# Patient Record
Sex: Female | Born: 1975
Health system: Southern US, Community
[De-identification: ages and names within clinical notes are randomized; demographics above are authoritative.]

## PROBLEM LIST (undated history)

## (undated) DIAGNOSIS — J45909 Unspecified asthma, uncomplicated: Secondary | ICD-10-CM

## (undated) DIAGNOSIS — F32A Depression, unspecified: Secondary | ICD-10-CM

## (undated) DIAGNOSIS — Z973 Presence of spectacles and contact lenses: Secondary | ICD-10-CM

## (undated) DIAGNOSIS — Q796 Ehlers-Danlos syndrome, unspecified: Secondary | ICD-10-CM

## (undated) DIAGNOSIS — U071 COVID-19: Secondary | ICD-10-CM

## (undated) DIAGNOSIS — F909 Attention-deficit hyperactivity disorder, unspecified type: Secondary | ICD-10-CM

## (undated) DIAGNOSIS — F419 Anxiety disorder, unspecified: Secondary | ICD-10-CM

## (undated) DIAGNOSIS — B009 Herpesviral infection, unspecified: Secondary | ICD-10-CM

## (undated) DIAGNOSIS — I10 Essential (primary) hypertension: Secondary | ICD-10-CM

## (undated) HISTORY — DX: Unspecified asthma, uncomplicated: J45.909

## (undated) HISTORY — DX: Herpesviral infection, unspecified: B00.9

## (undated) HISTORY — DX: Essential (primary) hypertension: I10

## (undated) HISTORY — PX: NASAL SEPTUM SURGERY: SHX37

---

## 1998-07-20 ENCOUNTER — Inpatient Hospital Stay (HOSPITAL_COMMUNITY): Admission: AD | Admit: 1998-07-20 | Discharge: 1998-07-21 | Payer: Self-pay | Admitting: Obstetrics and Gynecology

## 1999-02-16 ENCOUNTER — Other Ambulatory Visit: Admission: RE | Admit: 1999-02-16 | Discharge: 1999-02-16 | Payer: Self-pay | Admitting: Obstetrics and Gynecology

## 2000-03-23 ENCOUNTER — Other Ambulatory Visit: Admission: RE | Admit: 2000-03-23 | Discharge: 2000-03-23 | Payer: Self-pay | Admitting: Obstetrics and Gynecology

## 2003-02-24 ENCOUNTER — Other Ambulatory Visit: Admission: RE | Admit: 2003-02-24 | Discharge: 2003-02-24 | Payer: Self-pay | Admitting: Family Medicine

## 2004-03-09 ENCOUNTER — Other Ambulatory Visit: Admission: RE | Admit: 2004-03-09 | Discharge: 2004-03-09 | Payer: Self-pay | Admitting: Family Medicine

## 2005-03-31 ENCOUNTER — Other Ambulatory Visit: Admission: RE | Admit: 2005-03-31 | Discharge: 2005-03-31 | Payer: Self-pay | Admitting: Family Medicine

## 2006-11-20 ENCOUNTER — Inpatient Hospital Stay (HOSPITAL_COMMUNITY): Admission: AD | Admit: 2006-11-20 | Discharge: 2006-11-20 | Payer: Self-pay | Admitting: Obstetrics and Gynecology

## 2006-12-07 ENCOUNTER — Inpatient Hospital Stay (HOSPITAL_COMMUNITY): Admission: AD | Admit: 2006-12-07 | Discharge: 2006-12-07 | Payer: Self-pay | Admitting: Obstetrics and Gynecology

## 2006-12-30 ENCOUNTER — Inpatient Hospital Stay (HOSPITAL_COMMUNITY): Admission: AD | Admit: 2006-12-30 | Discharge: 2006-12-30 | Payer: Self-pay | Admitting: Obstetrics and Gynecology

## 2007-01-03 ENCOUNTER — Inpatient Hospital Stay (HOSPITAL_COMMUNITY): Admission: AD | Admit: 2007-01-03 | Discharge: 2007-01-05 | Payer: Self-pay | Admitting: Obstetrics and Gynecology

## 2007-01-07 ENCOUNTER — Encounter: Admission: RE | Admit: 2007-01-07 | Discharge: 2007-02-06 | Payer: Self-pay | Admitting: Obstetrics and Gynecology

## 2007-02-07 ENCOUNTER — Encounter: Admission: RE | Admit: 2007-02-07 | Discharge: 2007-03-06 | Payer: Self-pay | Admitting: Obstetrics and Gynecology

## 2008-02-11 ENCOUNTER — Encounter (INDEPENDENT_AMBULATORY_CARE_PROVIDER_SITE_OTHER): Payer: Self-pay | Admitting: *Deleted

## 2008-02-27 ENCOUNTER — Ambulatory Visit: Payer: Self-pay | Admitting: Family Medicine

## 2008-02-27 DIAGNOSIS — J45909 Unspecified asthma, uncomplicated: Secondary | ICD-10-CM | POA: Insufficient documentation

## 2008-07-20 ENCOUNTER — Ambulatory Visit: Payer: Self-pay | Admitting: Family Medicine

## 2008-07-20 ENCOUNTER — Other Ambulatory Visit: Admission: RE | Admit: 2008-07-20 | Discharge: 2008-07-20 | Payer: Self-pay | Admitting: Family Medicine

## 2008-07-20 ENCOUNTER — Encounter: Payer: Self-pay | Admitting: Family Medicine

## 2008-07-21 LAB — CONVERTED CEMR LAB
Albumin: 3.9 g/dL (ref 3.5–5.2)
Chloride: 109 meq/L (ref 96–112)
Cholesterol: 145 mg/dL (ref 0–200)
Creatinine, Ser: 0.9 mg/dL (ref 0.4–1.2)
Eosinophils Absolute: 0.6 10*3/uL (ref 0.0–0.7)
Glucose, Bld: 88 mg/dL (ref 70–99)
HCT: 40.9 % (ref 36.0–46.0)
Hemoglobin: 14.4 g/dL (ref 12.0–15.0)
LDL Cholesterol: 91 mg/dL (ref 0–99)
Lymphocytes Relative: 23.7 % (ref 12.0–46.0)
MCHC: 35.3 g/dL (ref 30.0–36.0)
Monocytes Absolute: 0.3 10*3/uL (ref 0.1–1.0)
Monocytes Relative: 5.4 % (ref 3.0–12.0)
Neutrophils Relative %: 61 % (ref 43.0–77.0)
Platelets: 266 10*3/uL (ref 150.0–400.0)
TSH: 2.78 microintl units/mL (ref 0.35–5.50)
VLDL: 19.8 mg/dL (ref 0.0–40.0)

## 2008-07-22 ENCOUNTER — Encounter (INDEPENDENT_AMBULATORY_CARE_PROVIDER_SITE_OTHER): Payer: Self-pay | Admitting: *Deleted

## 2009-10-18 ENCOUNTER — Telehealth: Payer: Self-pay | Admitting: Family Medicine

## 2009-12-01 ENCOUNTER — Other Ambulatory Visit: Admission: RE | Admit: 2009-12-01 | Discharge: 2009-12-01 | Payer: Self-pay | Admitting: Family Medicine

## 2009-12-01 ENCOUNTER — Ambulatory Visit: Payer: Self-pay | Admitting: Family Medicine

## 2009-12-01 DIAGNOSIS — F329 Major depressive disorder, single episode, unspecified: Secondary | ICD-10-CM

## 2009-12-01 DIAGNOSIS — F32A Depression, unspecified: Secondary | ICD-10-CM | POA: Insufficient documentation

## 2009-12-01 DIAGNOSIS — J069 Acute upper respiratory infection, unspecified: Secondary | ICD-10-CM | POA: Insufficient documentation

## 2009-12-02 ENCOUNTER — Ambulatory Visit: Payer: Self-pay | Admitting: Family Medicine

## 2009-12-02 LAB — CONVERTED CEMR LAB
AST: 15 units/L (ref 0–37)
Alkaline Phosphatase: 75 units/L (ref 39–117)
BUN: 10 mg/dL (ref 6–23)
Basophils Relative: 0.7 % (ref 0.0–3.0)
Calcium: 9.2 mg/dL (ref 8.4–10.5)
Cholesterol: 171 mg/dL (ref 0–200)
Creatinine, Ser: 0.8 mg/dL (ref 0.4–1.2)
GFR calc non Af Amer: 82.14 mL/min (ref >60)
HCT: 40.4 % (ref 36.0–46.0)
Hemoglobin: 14 g/dL (ref 12.0–15.0)
MCHC: 34.7 g/dL (ref 30.0–36.0)
MCV: 95.7 fL (ref 78.0–100.0)
Monocytes Absolute: 0.5 10*3/uL (ref 0.1–1.0)
Neutrophils Relative %: 71.8 % (ref 43.0–77.0)
Potassium: 4.3 meq/L (ref 3.5–5.1)
RBC: 4.22 M/uL (ref 3.87–5.11)
RDW: 13.1 % (ref 11.5–14.6)
Sodium: 136 meq/L (ref 135–145)
Total Bilirubin: 1 mg/dL (ref 0.3–1.2)

## 2009-12-03 ENCOUNTER — Encounter: Payer: Self-pay | Admitting: Family Medicine

## 2009-12-03 LAB — CONVERTED CEMR LAB: Pap Smear: NEGATIVE

## 2010-04-12 NOTE — Letter (Signed)
Summary: Results Follow up Letter  [facility]  [address] [place], [place] [address]   Phone: [phone]  Fax: [phone]    12/03/2009 MRN: [id number]  [facility] [address] [place], [place]  [address]  Dear Ms. Gintz,  The following are the results of your recent test(s):  Test         Result    Pap Smear:        Normal            Sincerely,

## 2010-04-12 NOTE — Progress Notes (Signed)
Summary: Refill--Symbicort  Phone Note Refill Request Message from:  Patient on October 18, 2009 11:06 AM  Refills Requested: Medication #1:  SYMBICORT 160-4.5 MCG/ACT AERO 2 puffs two times a day.   Dosage confirmed as above?Dosage Confirmed   Supply Requested: 1 month [facility] ON [address] PT CAN BE CONTACTED AT [phone]  Next Appointment Scheduled: SEPTEMBER 21ST 2011  Follow-up for Phone Call        Patient home number is disconnected. Called patient and notified. Her new home number is [phone]. Lucious Groves CMA  October 18, 2009 3:58 PM     Prescriptions: SYMBICORT 160-4.5 MCG/ACT AERO (BUDESONIDE-FORMOTEROL FUMARATE) 2 puffs two times a day  #1 x 1   Entered by:   Lucious Groves CMA   Authorized by:   Loreen Freud DO   Signed by:   Lucious Groves CMA on 10/18/2009   Method used:   Electronically to        [facility]  [address] # [id number]* (retail)       [address]       [place], [place]  [address]       Ph: [phone] or [phone]       Fax: [phone]   RxID:   [id number]

## 2010-04-12 NOTE — Assessment & Plan Note (Signed)
Summary: CPX/FASTING/KN   Vital Signs:  Patient profile:   35 year old female Menstrual status:  regular LMP:     11/14/2009 Height:      64 inches Weight:      209.0 pounds BMI:     36.00 Temp:     98.4 degrees F oral Pulse rate:   72 / minute Pulse rhythm:   regular BP sitting:   120 / 90  (right arm)  Vitals Entered By: Almeta Monas CMA Duncan Dull) (December 01, 2009 9:03 AM) CC: CPX/Not fasting, Pap needed, URI symptoms LMP (date): 11/14/2009     Menstrual Status regular Enter LMP: 11/14/2009 Last PAP Result NEGATIVE FOR INTRAEPITHELIAL LESIONS OR MALIGNANCY.   History of Present Illness: Pt here for cpe and pap. Pt is complaining about anxiety and depression and ocd.  Pt is rehashing a lot in her head and she has trouble sleeping .  Pt also c/o gagging more even with brushing teeth.            This is a 35 year old woman who presents with URI symptoms.  The symptoms began 2 days ago.  The patient complains of nasal congestion, clear nasal discharge, and dry cough, but denies purulent nasal discharge, sore throat, productive cough, earache, and sick contacts.  The patient denies fever, low-grade fever (<100.5 degrees), fever of 100.5-103 degrees, fever of 103.1-104 degrees, fever to >104 degrees, stiff neck, dyspnea, wheezing, rash, vomiting, diarrhea, use of an antipyretic, and response to antipyretic.  The patient also reports itchy throat and sneezing.  The patient denies itchy watery eyes, seasonal symptoms, response to antihistamine, headache, muscle aches, and severe fatigue.  The patient denies the following risk factors for Strep sinusitis: unilateral facial pain, unilateral nasal discharge, poor response to decongestant, double sickening, tooth pain, Strep exposure, tender adenopathy, and absence of cough.    Asthma History    Initial Asthma Severity Rating:    Age range: 12+ years    Symptoms: 0-2 days/week    Nighttime Awakenings: 0-2/month    Interferes w/ normal  activity: no limitations    SABA use (not for EIB): 0-2 days/week    Exacerbations requiring oral systemic steroids: 0-1/year    Asthma Severity Assessment: Intermittent   Preventive Screening-Counseling & Management  Alcohol-Tobacco     Alcohol drinks/day: <1     Alcohol type: rare--wine or mixed drink     Smoking Status: never     Passive Smoke Exposure: no  Caffeine-Diet-Exercise     Caffeine use/day: 2     Does Patient Exercise: no     Exercise Counseling: to improve exercise regimen  Hep-HIV-STD-Contraception     HIV Risk: no     Sun Exposure-Excessive: remote  Safety-Violence-Falls     Seat Belt Use: 100  Current Medications (verified): 1)  Ortho-Cyclen (28) 0.25-35 Mg-Mcg Tabs (Norgestimate-Eth Estradiol) .... As Directed 2)  Singulair 10 Mg Tabs (Montelukast Sodium) .Marland Kitchen.. 1 By Mouth Once Daily 3)  Proventil Hfa 108 (90 Base) Mcg/act Aers (Albuterol Sulfate) .... 2 Puffs Qid As Needed 4)  Symbicort 160-4.5 Mcg/act Aero (Budesonide-Formoterol Fumarate) .... 2 Puffs Two Times A Day 5)  Flonase 50 Mcg/act Susp (Fluticasone Propionate) .... 2 Sprays Each Nostril Once Daily 6)  Claritin 10 Mg Tabs (Loratadine) .Marland Kitchen.. 1 By Mouth Once Daily As Needed 7)  Pristiq 50 Mg Xr24h-Tab (Desvenlafaxine Succinate) .Marland Kitchen.. 1 By Mouth Once Daily  Allergies (verified): 1)  ! Pcn  Past History:  Past Medical History: Last updated:  02/27/2008 Asthma  Past Surgical History: Last updated: 02/27/2008 none  Family History: Last updated: 12/01/2009 dm-father,paternal aunt htn-mother,father stroke-mother,maternal uncle,grandparent Family History of Aneurysm Aortic  Social History: Last updated: 02/27/2008 Occupation:Works with women going through chem--prothetics , wigs Married, 3 children Never Smoked Alcohol use-yes Drug use-no Regular exercise-yes  Risk Factors: Alcohol Use: <1 (12/01/2009) Caffeine Use: 2 (12/01/2009) Exercise: no (12/01/2009)  Risk Factors: Smoking  Status: never (12/01/2009) Passive Smoke Exposure: no (12/01/2009)  Family History: Reviewed history from 02/27/2008 and no changes required. dm-father,paternal aunt htn-mother,father stroke-mother,maternal uncle,grandparent Family History of Aneurysm Aortic  Social History: Reviewed history from 02/27/2008 and no changes required. Occupation:Works with women going through chem--prothetics , wigs Married, 3 children Never Smoked Alcohol use-yes Drug use-no Regular exercise-yes Does Patient Exercise:  no  Review of Systems      See HPI General:  Denies chills, fatigue, fever, loss of appetite, malaise, sleep disorder, sweats, weakness, and weight loss. Eyes:  Denies blurring, discharge, double vision, eye irritation, eye pain, halos, itching, light sensitivity, red eye, vision loss-1 eye, and vision loss-both eyes. ENT:  Complains of nasal congestion; denies decreased hearing, difficulty swallowing, ear discharge, earache, hoarseness, nosebleeds, postnasal drainage, ringing in ears, sinus pressure, and sore throat. CV:  Denies bluish discoloration of lips or nails, chest pain or discomfort, difficulty breathing at night, difficulty breathing while lying down, fainting, fatigue, leg cramps with exertion, lightheadness, near fainting, palpitations, shortness of breath with exertion, swelling of feet, swelling of hands, and weight gain. Resp:  Denies chest discomfort, chest pain with inspiration, cough, coughing up blood, excessive snoring, hypersomnolence, morning headaches, pleuritic, shortness of breath, sputum productive, and wheezing. GI:  Denies abdominal pain, bloody stools, change in bowel habits, constipation, dark tarry stools, diarrhea, excessive appetite, gas, hemorrhoids, indigestion, loss of appetite, nausea, vomiting, vomiting blood, and yellowish skin color. GU:  Denies abnormal vaginal bleeding, decreased libido, discharge, dysuria, genital sores, hematuria, incontinence,  nocturia, urinary frequency, and urinary hesitancy. MS:  Denies joint pain, joint redness, joint swelling, loss of strength, low back pain, mid back pain, muscle aches, muscle , cramps, muscle weakness, stiffness, and thoracic pain. Derm:  Denies changes in color of skin, changes in nail beds, dryness, excessive perspiration, flushing, hair loss, insect bite(s), itching, lesion(s), poor wound healing, and rash. Neuro:  Denies brief paralysis, difficulty with concentration, disturbances in coordination, falling down, headaches, inability to speak, memory loss, numbness, poor balance, seizures, sensation of room spinning, tingling, tremors, visual disturbances, and weakness. Psych:  Denies alternate hallucination ( auditory/visual), anxiety, depression, easily angered, easily tearful, irritability, mental problems, panic attacks, sense of great danger, suicidal thoughts/plans, thoughts of violence, unusual visions or sounds, and thoughts /plans of harming others. Endo:  Denies cold intolerance, excessive hunger, excessive thirst, excessive urination, heat intolerance, polyuria, and weight change. Heme:  Denies abnormal bruising, bleeding, enlarge lymph nodes, fevers, pallor, and skin discoloration. Allergy:  Complains of itching eyes, seasonal allergies, and sneezing; denies hives or rash and persistent infections.  Physical Exam  General:  Well-developed,well-nourished,in no acute distress; alert,appropriate and cooperative throughout examination Head:  Normocephalic and atraumatic without obvious abnormalities. No apparent alopecia or balding. Eyes:  vision grossly intact, pupils equal, pupils round, pupils reactive to light, and no injection.   Ears:  External ear exam shows no significant lesions or deformities.  Otoscopic examination reveals clear canals, tympanic membranes are intact bilaterally without bulging, retraction, inflammation or discharge. Hearing is grossly normal bilaterally. Nose:   no external deformity, mucosal erythema, and mucosal  edema.   Mouth:  Oral mucosa and oropharynx without lesions or exudates.  Teeth in good repair. Neck:  No deformities, masses, or tenderness noted. Chest Wall:  No deformities, masses, or tenderness noted. Breasts:  No mass, nodules, thickening, tenderness, bulging, retraction, inflamation, nipple discharge or skin changes noted.   Lungs:  Normal respiratory effort, chest expands symmetrically. Lungs are clear to auscultation, no crackles or wheezes. Heart:  normal rate and no murmur.   Abdomen:  Bowel sounds positive,abdomen soft and non-tender without masses, organomegaly or hernias noted. Genitalia:  Pelvic Exam:        External: normal female genitalia without lesions or masses        Vagina: normal without lesions or masses        Cervix: normal without lesions or masses        Adnexa: normal bimanual exam without masses or fullness        Uterus: normal by palpation        Pap smear: performed Msk:  normal ROM, no joint tenderness, no joint swelling, no joint warmth, no redness over joints, no joint deformities, no joint instability, and no crepitation.   Pulses:  R posterior tibial normal, R dorsalis pedis normal, R carotid normal, L posterior tibial normal, L dorsalis pedis normal, and L carotid normal.   Extremities:  No clubbing, cyanosis, edema, or deformity noted with normal full range of motion of all joints.   Neurologic:  No cranial nerve deficits noted. Station and gait are normal. Plantar reflexes are down-going bilaterally. DTRs are symmetrical throughout. Sensory, motor and coordinative functions appear intact. Skin:  Intact without suspicious lesions or rashes Cervical Nodes:  No lymphadenopathy noted Axillary Nodes:  No palpable lymphadenopathy Psych:  Cognition and judgment appear intact. Alert and cooperative with normal attention span and concentration. No apparent delusions, illusions, hallucinations   Impression &  Recommendations:  Problem # 1:  PREVENTIVE HEALTH CARE (ICD-V70.0) check fasting labs ghm utd  Problem # 2:  ASTHMA (ICD-493.90)  Her updated medication list for this problem includes:    Singulair 10 Mg Tabs (Montelukast sodium) .Marland Kitchen... 1 by mouth once daily    Proventil Hfa 108 (90 Base) Mcg/act Aers (Albuterol sulfate) .Marland Kitchen... 2 puffs qid as needed    Symbicort 160-4.5 Mcg/act Aero (Budesonide-formoterol fumarate) .Marland Kitchen... 2 puffs two times a day  Problem # 3:  URI (ICD-465.9)  Her updated medication list for this problem includes:    Claritin 10 Mg Tabs (Loratadine) .Marland Kitchen... 1 by mouth once daily as needed     flonase   Instructed on symptomatic treatment. Call if symptoms persist or worsen.   Problem # 4:  DEPRESSION, MILD (ICD-311)  Her updated medication list for this problem includes:    Pristiq 50 Mg Xr24h-tab (Desvenlafaxine succinate) .Marland Kitchen... 1 by mouth once daily  Complete Medication List: 1)  Ortho-cyclen (28) 0.25-35 Mg-mcg Tabs (Norgestimate-eth estradiol) .... As directed 2)  Singulair 10 Mg Tabs (Montelukast sodium) .Marland Kitchen.. 1 by mouth once daily 3)  Proventil Hfa 108 (90 Base) Mcg/act Aers (Albuterol sulfate) .... 2 puffs qid as needed 4)  Symbicort 160-4.5 Mcg/act Aero (Budesonide-formoterol fumarate) .... 2 puffs two times a day 5)  Flonase 50 Mcg/act Susp (Fluticasone propionate) .... 2 sprays each nostril once daily 6)  Claritin 10 Mg Tabs (Loratadine) .Marland Kitchen.. 1 by mouth once daily as needed 7)  Pristiq 50 Mg Xr24h-tab (Desvenlafaxine succinate) .Marland Kitchen.. 1 by mouth once daily  Other Orders: Admin 1st Vaccine (16109) Flu Vaccine  3yrs + (16109) Flu Vaccine Consent Questions     Do you have a history of severe allergic reactions to this vaccine? no    Any prior history of allergic reactions to egg and/or gelatin? no    Do you have a sensitivity to the preservative Thimersol? no    Do you have a past history of Guillan-Barre Syndrome? no    Do you currently have an acute febrile  illness? no    Have you ever had a severe reaction to latex? no    Vaccine information given and explained to patient? yes    Are you currently pregnant? no    Lot Number:[id number]   Exp Date:09/10/2010   Site Given  Left Deltoid IM 6)  Claritin 10 Mg Tabs (Loratadine) .Marland Kitchen.. 1 by mouth once daily as needed 7)  Pristiq 50 Mg Xr24h-tab (Desvenlafaxine succinate) .Marland Kitchen.. 1 by mouth once daily  Other Orders: Admin 1st Vaccine (60454) Flu Vaccine 53yrs + (09811) .lbflu  Patient Instructions: 1)  V70.0  cbcd, hep, bmp, lipid, tsh, ua--fasting labs 2)  f/u 4-6 weeks Prescriptions: CLARITIN 10 MG TABS (LORATADINE) 1 by mouth once daily as needed  #30 x 5   Entered and Authorized by:   Loreen Freud DO   Signed by:   Loreen Freud DO on 12/01/2009   Method used:   Electronically to        [facility] # [id number]* (retail)       [address]       [place], [place]  [address]       Ph: [phone] or [phone]       Fax: [phone]   RxID:   [id number] FLONASE 50 MCG/ACT SUSP (FLUTICASONE PROPIONATE) 2 sprays each nostril once daily  #1 x 5   Entered and Authorized by:   Loreen Freud DO   Signed by:   Loreen Freud DO on 12/01/2009   Method used:   Electronically to        [facility] # [id number]* (retail)       [address]       [place], [place]  [address]       Ph: [phone] or [phone]       Fax: [phone]   RxID:   [id number]   TD Result Date:  11/25/2003 TD Result:  given TD Next Due:  10 yr

## 2010-07-26 NOTE — H&P (Signed)
Katelyn Wilcox, Katelyn Wilcox              ACCOUNT NO.:  [id number]   MEDICAL RECORD NO.:  [id number]          PATIENT TYPE:  INP   LOCATION:  9140                          FACILITY:  [facility]   PHYSICIAN:  Hal Morales, M.D.DATE OF BIRTH:  [birth year]   DATE OF ADMISSION:  01/03/2007  DATE OF DISCHARGE:                              HISTORY & PHYSICAL   This is a 34 year old, gravida 3, para 2-0-0-2 at 39-6/7 weeks who  presented to [facility] with urge to push. She had called me about an hour  before with contractions that were every 3-12 minutes. History is  remarkable for:  1. Asthma.  2. PENICILLIN allergy.  3. Jehovah's Witness.  4. PIH starting at 32 weeks with a negative labs recently.  5. Group B strep positive.   ALLERGIES:  PENICILLIN.   OB HISTORY:  Remarkable for vaginal delivery in 1998 of a female infant at  [redacted] weeks gestation weighing 7 pounds 11 ounces, remarkable for a nuchal  cord. She had a vaginal delivery in 2000 of a female infant at [redacted] weeks  gestation weighing 7 pounds 7 ounces remarkable for a knot in the cord.   MEDICAL HISTORY:  Remarkable for hyperemesis with her first pregnancy,  childhood varicella, history of asthma.   SURGICAL HISTORY:  Remarkable for repair of deviated septum and removal  of wisdom teeth.   FAMILY HISTORY:  Remarkable for grandfather with heart disease both  parents with hypertension, mother with varicosities, father with  diabetes, sister with thyroid problems. Mother with migraines and  stroke. Grandparents with strokes.   GENETIC HISTORY:  Negative.   SOCIAL HISTORY:  The patient is married to Katelyn Wilcox who is involved  and supportive.  She is of the Jehovah's Witness faith. She denies any  alcohol, tobacco or drug use.   PRENATAL LABS:  Hemoglobin 13.8, platelets 293.  Blood type O+, antibody  screen negative, RPR nonreactive, rubella immune.  Hepatitis negative.  Cystic fibrosis declined.   HISTORY OF CURRENT PREGNANCY:  The  patient entered care at [redacted] weeks  gestation. She had a normal Pap and negative cultures at that time.  Ultrasound at 19 weeks was normal, Glucola at 26 weeks was normal. Blood  pressure was elevated 32 weeks. PIH labs are normal.  Group B strep was  positive at term. She had a 24-hour urine done at 36-1/2 weeks which was  75 and other labs were normal. Blood pressures ranged 120-130 over 72-90  in the late term and she had a BPP December 30, 2006 that was 6/8.   PHYSICAL EXAMINATION:  VITAL SIGNS:  Stable except for blood pressures  140-150s/90s.  HEENT:  Within normal limits.  Thyroid normal not enlarged.  CHEST:  Clear to auscultation.  HEART:  Regular rate and rhythm.  ABDOMEN:  Gravid, vertex to Leopold's.  EXTREMITIES:  Show trace edema and 2+ DTRs. Fetal heart rate was 150s  prior to delivery. Cervix was 10 cm and +1 station on admission.  Spontaneous vaginal delivery occurred at 2226 OA  over intact perineum  of a viable female infant weighing 6 pounds  12 ounces, Apgars 9&9,  placenta delivered spontaneously and intact with three-vessel cord.  There was a small first-degree perineal laceration which was repaired  with 3-0 Monocryl under a local anesthetic. EBL was less than 100 mL.   ASSESSMENT:  1. Intrauterine pregnancy at 39-6/7 weeks, now delivered.  2. Pregnancy induced hypertension.   PLAN:  1. Routine postpartum orders.  2. PIH labs.  3. Will check BPs and notify physician if elevated.      Katelyn Wilcox, C.N.M.      Hal Morales, M.D.  Electronically Signed    MLW/MEDQ  D:  01/03/2007  T:  01/05/2007  Job:  [id number]

## 2010-12-21 LAB — URIC ACID: Uric Acid, Serum: 5.6

## 2010-12-21 LAB — URINALYSIS, ROUTINE W REFLEX MICROSCOPIC
Bilirubin Urine: NEGATIVE
Glucose, UA: NEGATIVE
Hgb urine dipstick: NEGATIVE
Ketones, ur: NEGATIVE
Nitrite: NEGATIVE
Protein, ur: NEGATIVE
Specific Gravity, Urine: 1.01
Urobilinogen, UA: 0.2
pH: 6

## 2010-12-21 LAB — COMPREHENSIVE METABOLIC PANEL
ALT: 13
ALT: 13
AST: 19
AST: 21
Albumin: 2.7 — ABNORMAL LOW
Alkaline Phosphatase: 153 — ABNORMAL HIGH
BUN: 8
CO2: 22
CO2: 22
Calcium: 9.8
Chloride: 103
Chloride: 106
Creatinine, Ser: 0.57
GFR calc Af Amer: >60
GFR calc non Af Amer: >60
Glucose, Bld: 84
Glucose, Bld: 92
Potassium: 3.6
Potassium: 4
Sodium: 133 — ABNORMAL LOW
Sodium: 135
Total Bilirubin: 1
Total Protein: 6.1

## 2010-12-21 LAB — CBC
HCT: 37.9
Hemoglobin: 13.1
MCHC: 34.2
MCHC: 34.6
MCHC: 34.6
MCV: 97.2
MCV: 97.5
MCV: 98.4
Platelets: 224
Platelets: 238
Platelets: 264
RBC: 3.94
RDW: 13.5
WBC: 11.5 — ABNORMAL HIGH

## 2010-12-21 LAB — RPR: RPR Ser Ql: NONREACTIVE

## 2010-12-21 LAB — LACTATE DEHYDROGENASE
LDH: 129
LDH: 141

## 2010-12-22 LAB — CBC
HCT: 39.6
MCHC: 34.5
MCV: 97.8
RDW: 13.9

## 2010-12-22 LAB — COMPREHENSIVE METABOLIC PANEL
AST: 18
BUN: 6
Sodium: 136
Total Bilirubin: 1
Total Protein: 6.1

## 2010-12-22 LAB — URIC ACID: Uric Acid, Serum: 5

## 2010-12-23 LAB — CBC
HCT: 39.2
MCHC: 35.1
Platelets: 294
RBC: 4.07
RDW: 13.2
WBC: 9.1

## 2010-12-23 LAB — COMPREHENSIVE METABOLIC PANEL
ALT: 12
AST: 15
Albumin: 2.7 — ABNORMAL LOW
Alkaline Phosphatase: 138 — ABNORMAL HIGH
Calcium: 8.7
Chloride: 107
GFR calc Af Amer: >60
GFR calc non Af Amer: >60
Potassium: 3.9
Total Protein: 5.8 — ABNORMAL LOW

## 2010-12-23 LAB — LACTATE DEHYDROGENASE: LDH: 122

## 2010-12-23 LAB — URIC ACID: Uric Acid, Serum: 5.3

## 2011-04-09 ENCOUNTER — Ambulatory Visit: Payer: Self-pay

## 2011-04-09 DIAGNOSIS — I10 Essential (primary) hypertension: Secondary | ICD-10-CM

## 2011-04-09 DIAGNOSIS — R05 Cough: Secondary | ICD-10-CM

## 2011-04-09 DIAGNOSIS — R059 Cough, unspecified: Secondary | ICD-10-CM

## 2011-04-09 DIAGNOSIS — J069 Acute upper respiratory infection, unspecified: Secondary | ICD-10-CM

## 2011-04-09 DIAGNOSIS — J309 Allergic rhinitis, unspecified: Secondary | ICD-10-CM

## 2011-05-24 ENCOUNTER — Other Ambulatory Visit: Payer: Self-pay | Admitting: Internal Medicine

## 2011-06-27 ENCOUNTER — Other Ambulatory Visit: Payer: Self-pay | Admitting: Internal Medicine

## 2011-07-02 ENCOUNTER — Ambulatory Visit: Payer: Self-pay

## 2011-07-02 ENCOUNTER — Ambulatory Visit: Payer: Self-pay | Admitting: Emergency Medicine

## 2011-07-02 DIAGNOSIS — R05 Cough: Secondary | ICD-10-CM

## 2011-07-02 DIAGNOSIS — R0602 Shortness of breath: Secondary | ICD-10-CM

## 2011-07-02 DIAGNOSIS — R059 Cough, unspecified: Secondary | ICD-10-CM

## 2011-07-02 DIAGNOSIS — J45909 Unspecified asthma, uncomplicated: Secondary | ICD-10-CM

## 2011-07-02 DIAGNOSIS — I10 Essential (primary) hypertension: Secondary | ICD-10-CM

## 2011-07-02 MED ORDER — BUDESONIDE-FORMOTEROL FUMARATE 80-4.5 MCG/ACT IN AERO: 2.0000 | INHALATION_SPRAY | Freq: Two times a day (BID) | RESPIRATORY_TRACT | Status: DC

## 2011-07-02 MED ORDER — CHLORTHALIDONE 25 MG PO TABS: 25.0000 mg | ORAL_TABLET | Freq: Every day | ORAL | Status: DC

## 2011-07-02 MED ORDER — ALBUTEROL SULFATE (2.5 MG/3ML) 0.083% IN NEBU
2.5000 mg | INHALATION_SOLUTION | Freq: Once | RESPIRATORY_TRACT | Status: AC
  Administered 2011-07-02: 2.5 mg via RESPIRATORY_TRACT

## 2011-07-02 MED ORDER — ALBUTEROL SULFATE (2.5 MG/3ML) 0.083% IN NEBU: INHALATION_SOLUTION | RESPIRATORY_TRACT | Status: DC

## 2011-07-02 MED ORDER — PREDNISONE 20 MG PO TABS: ORAL_TABLET | ORAL | Status: DC

## 2011-07-02 MED ORDER — ALBUTEROL SULFATE HFA 108 (90 BASE) MCG/ACT IN AERS: 2.0000 | INHALATION_SPRAY | Freq: Four times a day (QID) | RESPIRATORY_TRACT | Status: DC | PRN

## 2011-07-02 MED ORDER — MONTELUKAST SODIUM 10 MG PO TABS: 10.0000 mg | ORAL_TABLET | Freq: Every day | ORAL | Status: DC

## 2011-07-02 NOTE — Progress Notes (Signed)
  Subjective:    Patient ID: Katelyn Wilcox, female    DOB: [birth year], 37 y.o.   MRN: [id number]  HPI patient presents with a history that for the last 6-7 months she has been bothered with chronic cough. She has tried antibiotics steroids. She is on Symbicort she uses twice a day. 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 She has had head congestion and sinus congestion associated with this she has no other ongoing medical problems except for hypertension. She has not been taking her blood pressure medication    Review of Systems     Objective:   Physical Exam HEENT exam turbinates are swollen blue. TMs are clear. Neck is supple. Throat is normal chest examination reveals bilateral wheezes   [facility] reading (PRIMARY) by  Dr.Teresina Bugaj chest x-ray shows no acute disease. Sinus films show mild thickening at the base of the right maxillary sinus.        Assessment & Plan:  Patient has a chronic cough I suspect this to 2 uncontrolled asthma. We'll go ahead and check another chest x-ray sinus films

## 2011-07-03 ENCOUNTER — Telehealth: Payer: Self-pay | Admitting: Radiology

## 2011-07-03 NOTE — Telephone Encounter (Signed)
Message copied by Luretha Murphy on Mon Jul 03, 2011  8:47 AM ------      Message from: Lesle Chris A      Created: Sun Jul 02, 2011  7:03 PM       Please call chest x-ray is normal there is some mild sinus disease on your sinus films. We will see how he doing her current medication taper. If symptoms persist we may have to add an antibiotic

## 2011-07-03 NOTE — Telephone Encounter (Signed)
LMOM to CB. 

## 2011-07-03 NOTE — Progress Notes (Signed)
Left message to call back  

## 2011-07-04 NOTE — Progress Notes (Signed)
LMOM to call back

## 2011-07-07 NOTE — Progress Notes (Signed)
LMOM for patient to call back.

## 2012-07-12 ENCOUNTER — Other Ambulatory Visit: Payer: Self-pay | Admitting: Emergency Medicine

## 2012-08-21 ENCOUNTER — Encounter: Payer: Self-pay | Admitting: Family Medicine

## 2012-08-21 ENCOUNTER — Ambulatory Visit (INDEPENDENT_AMBULATORY_CARE_PROVIDER_SITE_OTHER): Payer: BC Managed Care – PPO | Admitting: Family Medicine

## 2012-08-21 ENCOUNTER — Other Ambulatory Visit (HOSPITAL_COMMUNITY)
Admission: RE | Admit: 2012-08-21 | Discharge: 2012-08-21 | Disposition: A | Discharge status: Home / Self Care | Payer: BC Managed Care – PPO | Source: Ambulatory Visit | Attending: Family Medicine | Admitting: Family Medicine

## 2012-08-21 VITALS — BP 118/86 | HR 75 | Temp 97.0°F | Ht 64.0 in | Wt 208.8 lb

## 2012-08-21 DIAGNOSIS — J45909 Unspecified asthma, uncomplicated: Secondary | ICD-10-CM

## 2012-08-21 DIAGNOSIS — Z01419 Encounter for gynecological examination (general) (routine) without abnormal findings: Secondary | ICD-10-CM | POA: Insufficient documentation

## 2012-08-21 DIAGNOSIS — Z0000 Encounter for general adult medical examination without abnormal findings: Secondary | ICD-10-CM

## 2012-08-21 DIAGNOSIS — Z124 Encounter for screening for malignant neoplasm of cervix: Secondary | ICD-10-CM

## 2012-08-21 DIAGNOSIS — N390 Urinary tract infection, site not specified: Secondary | ICD-10-CM

## 2012-08-21 DIAGNOSIS — I10 Essential (primary) hypertension: Secondary | ICD-10-CM

## 2012-08-21 LAB — HEPATIC FUNCTION PANEL
AST: 14 U/L (ref 0–37)
Albumin: 3.8 g/dL (ref 3.5–5.2)
Alkaline Phosphatase: 67 U/L (ref 39–117)
Total Protein: 7.1 g/dL (ref 6.0–8.3)

## 2012-08-21 LAB — BASIC METABOLIC PANEL
CO2: 26 mEq/L (ref 19–32)
Calcium: 9.1 mg/dL (ref 8.4–10.5)
GFR: 89.43 mL/min (ref >60.00)
Potassium: 3.9 mEq/L (ref 3.5–5.1)
Sodium: 137 mEq/L (ref 135–145)

## 2012-08-21 LAB — CBC WITH DIFFERENTIAL/PLATELET
Basophils Relative: 1.3 % (ref 0.0–3.0)
Hemoglobin: 11.1 g/dL — ABNORMAL LOW (ref 12.0–15.0)
Lymphocytes Relative: 21.2 % (ref 12.0–46.0)
MCHC: 32.5 g/dL (ref 30.0–36.0)
Monocytes Relative: 6.2 % (ref 3.0–12.0)
Neutro Abs: 3.9 10*3/uL (ref 1.4–7.7)
RBC: 3.89 Mil/uL (ref 3.87–5.11)

## 2012-08-21 LAB — MICROALBUMIN / CREATININE URINE RATIO
Creatinine,U: 19.2 mg/dL
Microalb, Ur: 0.2 mg/dL (ref 0.0–1.9)

## 2012-08-21 LAB — TSH: TSH: 2.02 u[IU]/mL (ref 0.35–5.50)

## 2012-08-21 MED ORDER — MONTELUKAST SODIUM 10 MG PO TABS: 10.0000 mg | ORAL_TABLET | Freq: Every day | ORAL | Status: DC

## 2012-08-21 MED ORDER — BUDESONIDE-FORMOTEROL FUMARATE 80-4.5 MCG/ACT IN AERO: 2.0000 | INHALATION_SPRAY | Freq: Two times a day (BID) | RESPIRATORY_TRACT | Status: DC

## 2012-08-21 MED ORDER — CHLORTHALIDONE 25 MG PO TABS: 25.0000 mg | ORAL_TABLET | Freq: Every day | ORAL | Status: DC

## 2012-08-21 MED ORDER — ALBUTEROL SULFATE HFA 108 (90 BASE) MCG/ACT IN AERS: 2.0000 | INHALATION_SPRAY | Freq: Four times a day (QID) | RESPIRATORY_TRACT | Status: DC | PRN

## 2012-08-21 NOTE — Patient Instructions (Signed)
Preventive Care for Adults, Female A healthy lifestyle and preventive care can promote health and wellness. Preventive health guidelines for women include the following key practices.  A routine yearly physical is a good way to check with your caregiver about your health and preventive screening. It is a chance to share any concerns and updates on your health, and to receive a thorough exam.  Visit your dentist for a routine exam and preventive care every 6 months. Brush your teeth twice a day and floss once a day. Good oral hygiene prevents tooth decay and gum disease.  The frequency of eye exams is based on your age, health, family medical history, use of contact lenses, and other factors. Follow your caregiver's recommendations for frequency of eye exams.  Eat a healthy diet. Foods like vegetables, fruits, whole grains, low-fat dairy products, and lean protein foods contain the nutrients you need without too many calories. Decrease your intake of foods high in solid fats, added sugars, and salt. Eat the right amount of calories for you.Get information about a proper diet from your caregiver, if necessary.  Regular physical exercise is one of the most important things you can do for your health. Most adults should get at least 150 minutes of moderate-intensity exercise (any activity that increases your heart rate and causes you to sweat) each week. In addition, most adults need muscle-strengthening exercises on 2 or more days a week.  Maintain a healthy weight. The body mass index (BMI) is a screening tool to identify possible weight problems. It provides an estimate of body fat based on height and weight. Your caregiver can help determine your BMI, and can help you achieve or maintain a healthy weight.For adults 20 years and older:  A BMI below 18.5 is considered underweight.  A BMI of 18.5 to 24.9 is normal.  A BMI of 25 to 29.9 is considered overweight.  A BMI of 30 and above is  considered obese.  Maintain normal blood lipids and cholesterol levels by exercising and minimizing your intake of saturated fat. Eat a balanced diet with plenty of fruit and vegetables. Blood tests for lipids and cholesterol should begin at age 20 and be repeated every 5 years. If your lipid or cholesterol levels are high, you are over 50, or you are at high risk for heart disease, you may need your cholesterol levels checked more frequently.Ongoing high lipid and cholesterol levels should be treated with medicines if diet and exercise are not effective.  If you smoke, find out from your caregiver how to quit. If you do not use tobacco, do not start.  If you are pregnant, do not drink alcohol. If you are breastfeeding, be very cautious about drinking alcohol. If you are not pregnant and choose to drink alcohol, do not exceed 1 drink per day. One drink is considered to be 12 ounces (355 mL) of beer, 5 ounces (148 mL) of wine, or 1.5 ounces (44 mL) of liquor.  Avoid use of street drugs. Do not share needles with anyone. Ask for help if you need support or instructions about stopping the use of drugs.  High blood pressure causes heart disease and increases the risk of stroke. Your blood pressure should be checked at least every 1 to 2 years. Ongoing high blood pressure should be treated with medicines if weight loss and exercise are not effective.  If you are 55 to 38 years old, ask your caregiver if you should take aspirin to prevent strokes.  Diabetes   screening involves taking a blood sample to check your fasting blood sugar level. This should be done once every 3 years, after age 45, if you are within normal weight and without risk factors for diabetes. Testing should be considered at a younger age or be carried out more frequently if you are overweight and have at least 1 risk factor for diabetes.  Breast cancer screening is essential preventive care for women. You should practice "breast  self-awareness." This means understanding the normal appearance and feel of your breasts and may include breast self-examination. Any changes detected, no matter how small, should be reported to a caregiver. Women in their 20s and 30s should have a clinical breast exam (CBE) by a caregiver as part of a regular health exam every 1 to 3 years. After age 40, women should have a CBE every year. Starting at age 40, women should consider having a mammography (breast X-ray test) every year. Women who have a family history of breast cancer should talk to their caregiver about genetic screening. Women at a high risk of breast cancer should talk to their caregivers about having magnetic resonance imaging (MRI) and a mammography every year.  The Pap test is a screening test for cervical cancer. A Pap test can show cell changes on the cervix that might become cervical cancer if left untreated. A Pap test is a procedure in which cells are obtained and examined from the lower end of the uterus (cervix).  Women should have a Pap test starting at age 21.  Between ages 21 and 29, Pap tests should be repeated every 2 years.  Beginning at age 30, you should have a Pap test every 3 years as long as the past 3 Pap tests have been normal.  Some women have medical problems that increase the chance of getting cervical cancer. Talk to your caregiver about these problems. It is especially important to talk to your caregiver if a new problem develops soon after your last Pap test. In these cases, your caregiver may recommend more frequent screening and Pap tests.  The above recommendations are the same for women who have or have not gotten the vaccine for human papillomavirus (HPV).  If you had a hysterectomy for a problem that was not cancer or a condition that could lead to cancer, then you no longer need Pap tests. Even if you no longer need a Pap test, a regular exam is a good idea to make sure no other problems are  starting.  If you are between ages 65 and 70, and you have had normal Pap tests going back 10 years, you no longer need Pap tests. Even if you no longer need a Pap test, a regular exam is a good idea to make sure no other problems are starting.  If you have had past treatment for cervical cancer or a condition that could lead to cancer, you need Pap tests and screening for cancer for at least 20 years after your treatment.  If Pap tests have been discontinued, risk factors (such as a new sexual partner) need to be reassessed to determine if screening should be resumed.  The HPV test is an additional test that may be used for cervical cancer screening. The HPV test looks for the virus that can cause the cell changes on the cervix. The cells collected during the Pap test can be tested for HPV. The HPV test could be used to screen women aged 30 years and older, and should   be used in women of any age who have unclear Pap test results. After the age of 30, women should have HPV testing at the same frequency as a Pap test.  Colorectal cancer can be detected and often prevented. Most routine colorectal cancer screening begins at the age of 50 and continues through age 75. However, your caregiver may recommend screening at an earlier age if you have risk factors for colon cancer. On a yearly basis, your caregiver may provide home test kits to check for hidden blood in the stool. Use of a small camera at the end of a tube, to directly examine the colon (sigmoidoscopy or colonoscopy), can detect the earliest forms of colorectal cancer. Talk to your caregiver about this at age 50, when routine screening begins. Direct examination of the colon should be repeated every 5 to 10 years through age 75, unless early forms of pre-cancerous polyps or small growths are found.  Hepatitis C blood testing is recommended for all people born from 1945 through 1965 and any individual with known risks for hepatitis C.  Practice  safe sex. Use condoms and avoid high-risk sexual practices to reduce the spread of sexually transmitted infections (STIs). STIs include gonorrhea, chlamydia, syphilis, trichomonas, herpes, HPV, and human immunodeficiency virus (HIV). Herpes, HIV, and HPV are viral illnesses that have no cure. They can result in disability, cancer, and death. Sexually active women aged 25 and younger should be checked for chlamydia. Older women with new or multiple partners should also be tested for chlamydia. Testing for other STIs is recommended if you are sexually active and at increased risk.  Osteoporosis is a disease in which the bones lose minerals and strength with aging. This can result in serious bone fractures. The risk of osteoporosis can be identified using a bone density scan. Women ages 65 and over and women at risk for fractures or osteoporosis should discuss screening with their caregivers. Ask your caregiver whether you should take a calcium supplement or vitamin D to reduce the rate of osteoporosis.  Menopause can be associated with physical symptoms and risks. Hormone replacement therapy is available to decrease symptoms and risks. You should talk to your caregiver about whether hormone replacement therapy is right for you.  Use sunscreen with sun protection factor (SPF) of 30 or more. Apply sunscreen liberally and repeatedly throughout the day. You should seek shade when your shadow is shorter than you. Protect yourself by wearing long sleeves, pants, a wide-brimmed hat, and sunglasses year round, whenever you are outdoors.  Once a month, do a whole body skin exam, using a mirror to look at the skin on your back. Notify your caregiver of new moles, moles that have irregular borders, moles that are larger than a pencil eraser, or moles that have changed in shape or color.  Stay current with required immunizations.  Influenza. You need a dose every fall (or winter). The composition of the flu vaccine  changes each year, so being vaccinated once is not enough.  Pneumococcal polysaccharide. You need 1 to 2 doses if you smoke cigarettes or if you have certain chronic medical conditions. You need 1 dose at age 65 (or older) if you have never been vaccinated.  Tetanus, diphtheria, pertussis (Tdap, Td). Get 1 dose of Tdap vaccine if you are younger than age 65, are over 65 and have contact with an infant, are a healthcare worker, are pregnant, or simply want to be protected from whooping cough. After that, you need a Td   booster dose every 10 years. Consult your caregiver if you have not had at least 3 tetanus and diphtheria-containing shots sometime in your life or have a deep or dirty wound.  HPV. You need this vaccine if you are a woman age 26 or younger. The vaccine is given in 3 doses over 6 months.  Measles, mumps, rubella (MMR). You need at least 1 dose of MMR if you were born in 1957 or later. You may also need a second dose.  Meningococcal. If you are age 19 to 21 and a first-year college student living in a residence hall, or have one of several medical conditions, you need to get vaccinated against meningococcal disease. You may also need additional booster doses.  Zoster (shingles). If you are age 60 or older, you should get this vaccine.  Varicella (chickenpox). If you have never had chickenpox or you were vaccinated but received only 1 dose, talk to your caregiver to find out if you need this vaccine.  Hepatitis A. You need this vaccine if you have a specific risk factor for hepatitis A virus infection or you simply wish to be protected from this disease. The vaccine is usually given as 2 doses, 6 to 18 months apart.  Hepatitis B. You need this vaccine if you have a specific risk factor for hepatitis B virus infection or you simply wish to be protected from this disease. The vaccine is given in 3 doses, usually over 6 months. Preventive Services / Frequency Ages 19 to 39  Blood  pressure check.** / Every 1 to 2 years.  Lipid and cholesterol check.** / Every 5 years beginning at age 20.  Clinical breast exam.** / Every 3 years for women in their 20s and 30s.  Pap test.** / Every 2 years from ages 21 through 29. Every 3 years starting at age 30 through age 65 or 70 with a history of 3 consecutive normal Pap tests.  HPV screening.** / Every 3 years from ages 30 through ages 65 to 70 with a history of 3 consecutive normal Pap tests.  Hepatitis C blood test.** / For any individual with known risks for hepatitis C.  Skin self-exam. / Monthly.  Influenza immunization.** / Every year.  Pneumococcal polysaccharide immunization.** / 1 to 2 doses if you smoke cigarettes or if you have certain chronic medical conditions.  Tetanus, diphtheria, pertussis (Tdap, Td) immunization. / A one-time dose of Tdap vaccine. After that, you need a Td booster dose every 10 years.  HPV immunization. / 3 doses over 6 months, if you are 26 and younger.  Measles, mumps, rubella (MMR) immunization. / You need at least 1 dose of MMR if you were born in 1957 or later. You may also need a second dose.  Meningococcal immunization. / 1 dose if you are age 19 to 21 and a first-year college student living in a residence hall, or have one of several medical conditions, you need to get vaccinated against meningococcal disease. You may also need additional booster doses.  Varicella immunization.** / Consult your caregiver.  Hepatitis A immunization.** / Consult your caregiver. 2 doses, 6 to 18 months apart.  Hepatitis B immunization.** / Consult your caregiver. 3 doses usually over 6 months. Ages 40 to 64  Blood pressure check.** / Every 1 to 2 years.  Lipid and cholesterol check.** / Every 5 years beginning at age 20.  Clinical breast exam.** / Every year after age 40.  Mammogram.** / Every year beginning at age 40   and continuing for as long as you are in good health. Consult with your  caregiver.  Pap test.** / Every 3 years starting at age 30 through age 65 or 70 with a history of 3 consecutive normal Pap tests.  HPV screening.** / Every 3 years from ages 30 through ages 65 to 70 with a history of 3 consecutive normal Pap tests.  Fecal occult blood test (FOBT) of stool. / Every year beginning at age 50 and continuing until age 75. You may not need to do this test if you get a colonoscopy every 10 years.  Flexible sigmoidoscopy or colonoscopy.** / Every 5 years for a flexible sigmoidoscopy or every 10 years for a colonoscopy beginning at age 50 and continuing until age 75.  Hepatitis C blood test.** / For all people born from 1945 through 1965 and any individual with known risks for hepatitis C.  Skin self-exam. / Monthly.  Influenza immunization.** / Every year.  Pneumococcal polysaccharide immunization.** / 1 to 2 doses if you smoke cigarettes or if you have certain chronic medical conditions.  Tetanus, diphtheria, pertussis (Tdap, Td) immunization.** / A one-time dose of Tdap vaccine. After that, you need a Td booster dose every 10 years.  Measles, mumps, rubella (MMR) immunization. / You need at least 1 dose of MMR if you were born in 1957 or later. You may also need a second dose.  Varicella immunization.** / Consult your caregiver.  Meningococcal immunization.** / Consult your caregiver.  Hepatitis A immunization.** / Consult your caregiver. 2 doses, 6 to 18 months apart.  Hepatitis B immunization.** / Consult your caregiver. 3 doses, usually over 6 months. Ages 65 and over  Blood pressure check.** / Every 1 to 2 years.  Lipid and cholesterol check.** / Every 5 years beginning at age 20.  Clinical breast exam.** / Every year after age 40.  Mammogram.** / Every year beginning at age 40 and continuing for as long as you are in good health. Consult with your caregiver.  Pap test.** / Every 3 years starting at age 30 through age 65 or 70 with a 3  consecutive normal Pap tests. Testing can be stopped between 65 and 70 with 3 consecutive normal Pap tests and no abnormal Pap or HPV tests in the past 10 years.  HPV screening.** / Every 3 years from ages 30 through ages 65 or 70 with a history of 3 consecutive normal Pap tests. Testing can be stopped between 65 and 70 with 3 consecutive normal Pap tests and no abnormal Pap or HPV tests in the past 10 years.  Fecal occult blood test (FOBT) of stool. / Every year beginning at age 50 and continuing until age 75. You may not need to do this test if you get a colonoscopy every 10 years.  Flexible sigmoidoscopy or colonoscopy.** / Every 5 years for a flexible sigmoidoscopy or every 10 years for a colonoscopy beginning at age 50 and continuing until age 75.  Hepatitis C blood test.** / For all people born from 1945 through 1965 and any individual with known risks for hepatitis C.  Osteoporosis screening.** / A one-time screening for women ages 65 and over and women at risk for fractures or osteoporosis.  Skin self-exam. / Monthly.  Influenza immunization.** / Every year.  Pneumococcal polysaccharide immunization.** / 1 dose at age 65 (or older) if you have never been vaccinated.  Tetanus, diphtheria, pertussis (Tdap, Td) immunization. / A one-time dose of Tdap vaccine if you are over   65 and have contact with an infant, are a healthcare worker, or simply want to be protected from whooping cough. After that, you need a Td booster dose every 10 years.  Varicella immunization.** / Consult your caregiver.  Meningococcal immunization.** / Consult your caregiver.  Hepatitis A immunization.** / Consult your caregiver. 2 doses, 6 to 18 months apart.  Hepatitis B immunization.** / Check with your caregiver. 3 doses, usually over 6 months. ** Family history and personal history of risk and conditions may change your caregiver's recommendations. Document Released: 04/25/2001 Document Revised: 05/22/2011  Document Reviewed: 07/25/2010 [employer] Patient Information 2014 [employer].  

## 2012-08-21 NOTE — Progress Notes (Signed)
Subjective:     Katelyn Wilcox is a 37 y.o. female and is here for a comprehensive physical exam. The patient reports no problems.  History   Social History  . Marital Status: Married    Spouse Name: N/A    Number of Children: N/A  . Years of Education: N/A   Occupational History  . Not on file.   Social History Main Topics  . Smoking status: Never Smoker   . Smokeless tobacco: Not on file  . Alcohol Use: Not on file  . Drug Use: Not on file  . Sexually Active: Not on file   Other Topics Concern  . Not on file   Social History Narrative  . No narrative on file   Health Maintenance  Topic Date Due  . Influenza Vaccine  11/11/2012  . Pap Smear  12/01/2012  . Tetanus/tdap  11/24/2013    The following portions of the patient's history were reviewed and updated as appropriate:  She  has a past medical history of Asthma. She  does not have any pertinent problems on file. She  has past surgical history that includes Nasal septum surgery. Her family history includes Aortic aneurysm in an unspecified family member; Dementia in her paternal grandfather; Diabetes in her father and paternal aunt; Hashimoto's thyroiditis in her sister; Hypertension in her father and mother; Stroke in her maternal grandfather, maternal grandmother, maternal uncle, mother, and paternal grandmother; Thyroid cancer in an unspecified family member; and Thyroid disease in an unspecified family member. She  reports that she has never smoked. She does not have any smokeless tobacco history on file. Her alcohol and drug histories are not on file. She has a current medication list which includes the following prescription(s): albuterol, albuterol, budesonide-formoterol, chlorthalidone, and montelukast. Current Outpatient Prescriptions on File Prior to Visit  Medication Sig Dispense Refill  . albuterol (PROVENTIL) (2.5 MG/3ML) 0.083% nebulizer solution Used one premixed nebule per nebulizer every 6 hours as  needed for chest tightness  150 vial  1   No current facility-administered medications on file prior to visit.   She is allergic to penicillins..  Review of Systems Review of Systems  Constitutional: Negative for activity change, appetite change and fatigue.  HENT: Negative for hearing loss, congestion, tinnitus and ear discharge.  dentist -due Eyes: Negative for visual disturbance (see optho q1y -- vision corrected to 20/20 with glasses).  Respiratory: Negative for cough, chest tightness and shortness of breath.   Cardiovascular: Negative for chest pain, palpitations and leg swelling.  Gastrointestinal: Negative for abdominal pain, diarrhea, constipation and abdominal distention.  Genitourinary: Negative for urgency, frequency, decreased urine volume and difficulty urinating.  Musculoskeletal: Negative for back pain, arthralgias and gait problem.  Skin: Negative for color change, pallor and rash.  Neurological: Negative for dizziness, light-headedness, numbness and headaches.  Hematological: Negative for adenopathy. Does not bruise/bleed easily.  Psychiatric/Behavioral: Negative for suicidal ideas, confusion, sleep disturbance, self-injury, dysphoric mood, decreased concentration and agitation.       Objective:    BP 118/86  Pulse 75  Temp(Src) 97 F (36.1 C) (Tympanic)  Ht 5\' 4"  (1.626 m)  Wt 208 lb 12.8 oz (94.711 kg)  BMI 35.82 kg/m2  SpO2 99%  LMP 08/07/2012 General appearance: alert, cooperative, appears stated age and no distress Head: Normocephalic, without obvious abnormality, atraumatic Eyes: conjunctivae/corneas clear. PERRL, EOM's intact. Fundi benign. Ears: normal TM's and external ear canals both ears Nose: Nares normal. Septum midline. Mucosa normal. No drainage or sinus tenderness.  Throat: lips, mucosa, and tongue normal; teeth and gums normal Neck: no adenopathy, no carotid bruit, no JVD, supple, symmetrical, trachea midline and thyroid not enlarged,  symmetric, no tenderness/mass/nodules Back: symmetric, no curvature. ROM normal. No CVA tenderness. Lungs: clear to auscultation bilaterally Breasts: normal appearance, no masses or tenderness Heart: regular rate and rhythm, S1, S2 normal, no murmur, click, rub or gallop Abdomen: soft, non-tender; bowel sounds normal; no masses,  no organomegaly Pelvic: cervix normal in appearance, external genitalia normal, no adnexal masses or tenderness, no cervical motion tenderness, rectovaginal septum normal, uterus normal size, shape, and consistency, vagina normal without discharge and pap done Extremities: extremities normal, atraumatic, no cyanosis or edema Pulses: 2+ and symmetric Skin: Skin color, texture, turgor normal. No rashes or lesions Lymph nodes: Cervical, supraclavicular, and axillary nodes normal. Neurologic: Alert and oriented X 3, normal strength and tone. Normal symmetric reflexes. Normal coordination and gait Psych-no depression, no anxiety      Assessment:    Healthy female exam.      Plan:    ghm utd Check labs See After Visit Summary for Counseling Recommendations

## 2012-08-21 NOTE — Addendum Note (Signed)
Addended by: Silvio Pate D on: 08/21/2012 11:40 AM   Modules accepted: Orders

## 2012-08-21 NOTE — Assessment & Plan Note (Signed)
Refill meds

## 2012-08-23 LAB — URINE CULTURE: Colony Count: >=100000

## 2013-08-25 ENCOUNTER — Ambulatory Visit (INDEPENDENT_AMBULATORY_CARE_PROVIDER_SITE_OTHER): Payer: BC Managed Care – PPO | Admitting: Family Medicine

## 2013-08-25 ENCOUNTER — Encounter: Payer: Self-pay | Admitting: Family Medicine

## 2013-08-25 VITALS — BP 120/64 | HR 80 | Temp 98.8°F | Ht 64.25 in | Wt 217.2 lb

## 2013-08-25 DIAGNOSIS — R82998 Other abnormal findings in urine: Secondary | ICD-10-CM

## 2013-08-25 DIAGNOSIS — R8290 Unspecified abnormal findings in urine: Secondary | ICD-10-CM

## 2013-08-25 DIAGNOSIS — J45909 Unspecified asthma, uncomplicated: Secondary | ICD-10-CM

## 2013-08-25 DIAGNOSIS — Z0000 Encounter for general adult medical examination without abnormal findings: Secondary | ICD-10-CM

## 2013-08-25 DIAGNOSIS — Z23 Encounter for immunization: Secondary | ICD-10-CM

## 2013-08-25 DIAGNOSIS — E669 Obesity, unspecified: Secondary | ICD-10-CM | POA: Insufficient documentation

## 2013-08-25 LAB — HEPATIC FUNCTION PANEL
ALT: 13 U/L (ref 0–35)
AST: 16 U/L (ref 0–37)
Albumin: 4.1 g/dL (ref 3.5–5.2)
Alkaline Phosphatase: 78 U/L (ref 39–117)
BILIRUBIN DIRECT: 0.1 mg/dL (ref 0.0–0.3)
BILIRUBIN TOTAL: 1.1 mg/dL (ref 0.2–1.2)
TOTAL PROTEIN: 7.1 g/dL (ref 6.0–8.3)

## 2013-08-25 LAB — CBC WITH DIFFERENTIAL/PLATELET
BASOS PCT: 1.1 % (ref 0.0–3.0)
Basophils Absolute: 0.1 10*3/uL (ref 0.0–0.1)
EOS PCT: 6.6 % — AB (ref 0.0–5.0)
Eosinophils Absolute: 0.4 10*3/uL (ref 0.0–0.7)
HEMATOCRIT: 33.4 % — AB (ref 36.0–46.0)
Hemoglobin: 10.6 g/dL — ABNORMAL LOW (ref 12.0–15.0)
LYMPHS ABS: 1.3 10*3/uL (ref 0.7–4.0)
Lymphocytes Relative: 19.9 % (ref 12.0–46.0)
MCHC: 31.9 g/dL (ref 30.0–36.0)
MCV: 84.9 fl (ref 78.0–100.0)
MONOS PCT: 5.7 % (ref 3.0–12.0)
Monocytes Absolute: 0.4 10*3/uL (ref 0.1–1.0)
NEUTROS PCT: 66.7 % (ref 43.0–77.0)
Neutro Abs: 4.4 10*3/uL (ref 1.4–7.7)
PLATELETS: 331 10*3/uL (ref 150.0–400.0)
RBC: 3.93 Mil/uL (ref 3.87–5.11)
RDW: 14.8 % (ref 11.5–15.5)
WBC: 6.6 10*3/uL (ref 4.0–10.5)

## 2013-08-25 LAB — LIPID PANEL
CHOL/HDL RATIO: 3
Cholesterol: 173 mg/dL (ref 0–200)
HDL: 51.3 mg/dL (ref >39.00)
LDL CALC: 100 mg/dL — AB (ref 0–99)
NonHDL: 121.7
Triglycerides: 107 mg/dL (ref 0.0–149.0)
VLDL: 21.4 mg/dL (ref 0.0–40.0)

## 2013-08-25 LAB — BASIC METABOLIC PANEL
BUN: 8 mg/dL (ref 6–23)
CHLORIDE: 106 meq/L (ref 96–112)
CO2: 24 mEq/L (ref 19–32)
Calcium: 9 mg/dL (ref 8.4–10.5)
Creatinine, Ser: 0.9 mg/dL (ref 0.4–1.2)
GFR: 78.3 mL/min (ref >60.00)
Glucose, Bld: 92 mg/dL (ref 70–99)
POTASSIUM: 3.8 meq/L (ref 3.5–5.1)
SODIUM: 136 meq/L (ref 135–145)

## 2013-08-25 LAB — POCT URINALYSIS DIPSTICK
Bilirubin, UA: NEGATIVE
GLUCOSE UA: NEGATIVE
Ketones, UA: NEGATIVE
NITRITE UA: NEGATIVE
Spec Grav, UA: <=1.005
UROBILINOGEN UA: 0.2
pH, UA: 6

## 2013-08-25 LAB — TSH: TSH: 1.81 u[IU]/mL (ref 0.35–4.50)

## 2013-08-25 MED ORDER — BUDESONIDE-FORMOTEROL FUMARATE 80-4.5 MCG/ACT IN AERO: 2.0000 | INHALATION_SPRAY | Freq: Two times a day (BID) | RESPIRATORY_TRACT | Status: DC

## 2013-08-25 MED ORDER — MONTELUKAST SODIUM 10 MG PO TABS: 10.0000 mg | ORAL_TABLET | Freq: Every day | ORAL | Status: DC

## 2013-08-25 MED ORDER — ALBUTEROL SULFATE HFA 108 (90 BASE) MCG/ACT IN AERS: 2.0000 | INHALATION_SPRAY | Freq: Four times a day (QID) | RESPIRATORY_TRACT | Status: DC | PRN

## 2013-08-25 NOTE — Progress Notes (Signed)
Subjective:     Katelyn Wilcox is a 38 y.o. female and is here for a comprehensive physical exam. The patient reports no problems.  History   Social History  . Marital Status: Married    Spouse Name: N/A    Number of Children: N/A  . Years of Education: N/A   Occupational History  .      special place-- wigs , prosthetics   Social History Main Topics  . Smoking status: Never Smoker   . Smokeless tobacco: Not on file  . Alcohol Use: Yes     Comment: rare  . Drug Use: No  . Sexual Activity: Yes    Partners: Male   Other Topics Concern  . Not on file   Social History Narrative   Exercise--no   Health Maintenance  Topic Date Due  . Influenza Vaccine  10/11/2013  . Tetanus/tdap  11/24/2013  . Pap Smear  08/22/2015    The following portions of the patient's history were reviewed and updated as appropriate:  She  has a past medical history of Asthma and Hypertension. She  does not have any pertinent problems on file. She  has past surgical history that includes Nasal septum surgery. Her family history includes Aortic aneurysm in an other family member; Dementia in her paternal grandfather; Diabetes in her father and paternal aunt; Hashimoto's thyroiditis in her sister; Hypertension in her father and mother; Stroke in her maternal grandfather, maternal grandmother, maternal uncle, mother, and paternal grandmother; Thyroid cancer in an other family member; Thyroid disease in an other family member. She  reports that she has never smoked. She does not have any smokeless tobacco history on file. She reports that she drinks alcohol. She reports that she does not use illicit drugs. She has a current medication list which includes the following prescription(s): albuterol, budesonide-formoterol, chlorthalidone, and montelukast. Current Outpatient Prescriptions on File Prior to Visit  Medication Sig Dispense Refill  . chlorthalidone (HYGROTON) 25 MG tablet Take 1 tablet (25 mg total)  by mouth daily.  30 tablet  11   No current facility-administered medications on file prior to visit.   She is allergic to penicillins..  Review of Systems Review of Systems  Constitutional: Negative for activity change, appetite change and fatigue.  HENT: Negative for hearing loss, congestion, tinnitus and ear discharge.  dentist [id number] Eyes: Negative for visual disturbance (see optho q1y -- vision corrected to 20/20 with glasses).  Respiratory: Negative for cough, chest tightness and shortness of breath.   Cardiovascular: Negative for chest pain, palpitations and leg swelling.  Gastrointestinal: Negative for abdominal pain, diarrhea, constipation and abdominal distention.  Genitourinary: Negative for urgency, frequency, decreased urine volume and difficulty urinating.  Musculoskeletal: Negative for back pain, arthralgias and gait problem.  Skin: Negative for color change, pallor and rash.  Neurological: Negative for dizziness, light-headedness, numbness and headaches.  Hematological: Negative for adenopathy. Does not bruise/bleed easily.  Psychiatric/Behavioral: Negative for suicidal ideas, confusion, sleep disturbance, self-injury, dysphoric mood, decreased concentration and agitation.       Objective:    BP 120/64  Pulse 80  Temp(Src) 98.8 F (37.1 C) (Oral)  Ht 5' 4.25" (1.632 m)  Wt 217 lb 3.2 oz (98.521 kg)  BMI 36.99 kg/m2  SpO2 97%  LMP 08/25/2013 General appearance: alert, cooperative, appears stated age and no distress Head: Normocephalic, without obvious abnormality, atraumatic Eyes: conjunctivae/corneas clear. PERRL, EOM's intact. Fundi benign. Ears: normal TM's and external ear canals both ears Nose: Nares normal. Septum midline.  Mucosa normal. No drainage or sinus tenderness. Throat: lips, mucosa, and tongue normal; teeth and gums normal Neck: no adenopathy, no carotid bruit, no JVD, supple, symmetrical, trachea midline and thyroid not enlarged, symmetric, no  tenderness/mass/nodules Back: symmetric, no curvature. ROM normal. No CVA tenderness. Lungs: clear to auscultation bilaterally Breasts: normal appearance, no masses or tenderness Heart: regular rate and rhythm, S1, S2 normal, no murmur, click, rub or gallop Abdomen: soft, non-tender; bowel sounds normal; no masses,  no organomegaly Pelvic: deferred--on menses Extremities: extremities normal, atraumatic, no cyanosis or edema Pulses: 2+ and symmetric Skin: Skin color, texture, turgor normal. No rashes or lesions Lymph nodes: Cervical, supraclavicular, and axillary nodes normal. Neurologic: Alert and oriented X 3, normal strength and tone. Normal symmetric reflexes. Normal coordination and gait Psych-- no depression, no anxiety      Assessment:    Healthy female exam.      Plan:    ghm utd Check labs See After Visit Summary for Counseling Recommendations   1. Unspecified asthma(493.90)  stable - albuterol (VENTOLIN HFA) 108 (90 BASE) MCG/ACT inhaler; Inhale 2 puffs into the lungs every 6 (six) hours as needed for wheezing.  Dispense: 18 g; Refill: 0 - budesonide-formoterol (SYMBICORT) 80-4.5 MCG/ACT inhaler; Inhale 2 puffs into the lungs 2 (two) times daily. Needs office visit  Dispense: 10.2 g; Refill: 11 - montelukast (SINGULAIR) 10 MG tablet; Take 1 tablet (10 mg total) by mouth at bedtime.  Dispense: 30 tablet; Refill: 11  2. Preventative health care   - Basic metabolic panel - CBC with Differential - Hepatic function panel - Lipid panel - Microalbumin / creatinine urine ratio - POCT urinalysis dipstick - TSH

## 2013-08-25 NOTE — Addendum Note (Signed)
Addended by: Silvio PateHOMPSON, Seona Clemenson D on: 08/25/2013 03:46 PM   Modules accepted: Orders

## 2013-08-25 NOTE — Patient Instructions (Signed)

## 2013-08-25 NOTE — Progress Notes (Signed)
Pre visit review using our clinic review tool, if applicable. No additional management support is needed unless otherwise documented below in the visit note. 

## 2013-08-26 LAB — MICROALBUMIN / CREATININE URINE RATIO
Creatinine,U: 34.4 mg/dL
Microalb Creat Ratio: 3.5 mg/g (ref 0.0–30.0)
Microalb, Ur: 1.2 mg/dL (ref 0.0–1.9)

## 2013-08-28 LAB — URINE CULTURE: Colony Count: >=100000

## 2013-08-28 NOTE — Progress Notes (Signed)
cipro 500 mg 1 po bid x5 days

## 2013-08-28 NOTE — Progress Notes (Signed)
+   cipro 500 mg 1 po bid x 5 days

## 2013-11-25 ENCOUNTER — Ambulatory Visit: Payer: BC Managed Care – PPO | Admitting: Family Medicine

## 2013-11-25 ENCOUNTER — Encounter: Payer: Self-pay | Admitting: Family Medicine

## 2013-11-25 ENCOUNTER — Other Ambulatory Visit (HOSPITAL_COMMUNITY)
Admission: RE | Admit: 2013-11-25 | Discharge: 2013-11-25 | Disposition: A | Discharge status: Home / Self Care | Payer: BC Managed Care – PPO | Source: Ambulatory Visit | Attending: Family Medicine | Admitting: Family Medicine

## 2013-11-25 ENCOUNTER — Ambulatory Visit (INDEPENDENT_AMBULATORY_CARE_PROVIDER_SITE_OTHER): Payer: BC Managed Care – PPO | Admitting: Family Medicine

## 2013-11-25 VITALS — BP 144/88 | HR 83 | Temp 99.1°F | Wt 216.7 lb

## 2013-11-25 DIAGNOSIS — Z1151 Encounter for screening for human papillomavirus (HPV): Secondary | ICD-10-CM | POA: Insufficient documentation

## 2013-11-25 DIAGNOSIS — Z01419 Encounter for gynecological examination (general) (routine) without abnormal findings: Secondary | ICD-10-CM | POA: Insufficient documentation

## 2013-11-25 DIAGNOSIS — Z23 Encounter for immunization: Secondary | ICD-10-CM

## 2013-11-25 DIAGNOSIS — Z124 Encounter for screening for malignant neoplasm of cervix: Secondary | ICD-10-CM

## 2013-11-25 NOTE — Progress Notes (Signed)
  Subjective:     Katelyn Wilcox is a 38 y.o. woman who comes in today for a  pap smear only. Her most recent annual exam was on 08/26/23. Her most recent Pap smear was  and showed no abnormalities. Previous abnormal Pap smears: no. Contraception: none  The following portions of the patient's history were reviewed and updated as appropriate: allergies, current medications, past family history, past medical history, past social history, past surgical history and problem list.  Review of Systems Pertinent items are noted in HPI.   Objective:    BP 144/88  Pulse 83  Temp(Src) 99.1 F (37.3 C) (Oral)  Wt 216 lb 11.4 oz (98.3 kg)  SpO2 100%  LMP 11/12/2013 Pelvic Exam: cervix normal in appearance, external genitalia normal, no adnexal masses or tenderness, no bladder tenderness, no cervical motion tenderness, uterus normal size, shape, and consistency and vagina normal without discharge. Pap smear obtained.   Assessment:    Screening pap smear.   Plan:    Follow up in 1 year, or as indicated by Pap results.

## 2013-11-25 NOTE — Patient Instructions (Signed)
Pap Test A Pap test is a procedure done in a clinic office to evaluate cells that are on the surface of the cervix. The cervix is the lower portion of the uterus and upper portion of the vagina. For some women, the cervical region has the potential to form cancer. With consistent evaluations by your caregiver, this type of cancer can be prevented.  If a Pap test is abnormal, it is most often a result of a previous exposure to human papillomavirus (HPV). HPV is a virus that can infect the cells of the cervix and cause dysplasia. Dysplasia is where the cells no longer look normal. If a woman has been diagnosed with high-grade or severe dysplasia, they are at higher risk of developing cervical cancer. People diagnosed with low-grade dysplasia should still be seen by their caregiver because there is a small chance that low-grade dysplasia could develop into cancer.  LET YOUR CAREGIVER KNOW ABOUT:  Recent sexually transmitted infection (STI) you have had.  Any new sex partners you have had.  History of previous abnormal Pap tests results.  History of previous cervical procedures you have had (colposcopy, biopsy, loop electrosurgical excision procedure [LEEP]).  Concerns you have had regarding unusual vaginal discharge.  History of pelvic pain.  Your use of birth control. BEFORE THE PROCEDURE  Ask your caregiver when to schedule your Pap test. It is best not to be on your period if your caregiver uses a wooden spatula to collect cells or applies cells to a glass slide. Newer techniques are not so sensitive to the timing of a menstrual cycle.  Do not douche or have sexual intercourse for 24 hours before the test.   Do not use vaginal creams or tampons for 24 hours before the test.   Empty your bladder just before the test to lessen any discomfort.  PROCEDURE You will lie on an exam table with your feet in stirrups. A warm metal or plastic instrument (speculum) is placed in your vagina. This  instrument allows your caregiver to see the inside of your vagina and look at your cervix. A small, plastic brush or wooden spatula is then used to collect cervical cells. These cells are placed in a lab specimen container. The cells are looked at under a microscope. A specialist will determine if the cells are normal.  AFTER THE PROCEDURE Make sure to get your test results.If your results come back abnormal, you may need further testing.  Document Released: 05/20/2002 Document Revised: 05/22/2011 Document Reviewed: 02/23/2011 [employer] Patient Information 2015 [employer]. This information is not intended to replace advice given to you by your health care provider. Make sure you discuss any questions you have with your health care provider.  

## 2013-11-25 NOTE — Progress Notes (Signed)
Pre visit review using our clinic review tool, if applicable. No additional management support is needed unless otherwise documented below in the visit note. 

## 2013-11-27 ENCOUNTER — Ambulatory Visit: Payer: BC Managed Care – PPO | Admitting: Family Medicine

## 2013-11-28 ENCOUNTER — Telehealth: Payer: Self-pay | Admitting: Family Medicine

## 2013-11-28 NOTE — Telephone Encounter (Signed)
Caller name: Jamel Relation to pt: Call back number:[phone] Pharmacy: [facility], [address]   Reason for call:  Pt was calling in regarding Lexapro.  Pt thought she was going to get this prescribed to her.  She said send to pharmacy listed above.

## 2013-12-01 LAB — CYTOLOGY - PAP

## 2013-12-01 MED ORDER — ESCITALOPRAM OXALATE 10 MG PO TABS: 10.0000 mg | ORAL_TABLET | Freq: Every day | ORAL | Status: DC

## 2013-12-01 NOTE — Telephone Encounter (Signed)
No documentation of lexapro on chart or during last visit. Please advise     KP

## 2013-12-01 NOTE — Telephone Encounter (Signed)
Rx faxed.    KP 

## 2013-12-01 NOTE — Telephone Encounter (Signed)
lexapro 10 mg 1 po qhs #30  2 refills

## 2014-01-06 ENCOUNTER — Ambulatory Visit (INDEPENDENT_AMBULATORY_CARE_PROVIDER_SITE_OTHER): Payer: BC Managed Care – PPO | Admitting: Family Medicine

## 2014-01-06 ENCOUNTER — Encounter: Payer: Self-pay | Admitting: Family Medicine

## 2014-01-06 VITALS — BP 122/80 | HR 97 | Temp 98.6°F | Wt 211.2 lb

## 2014-01-06 DIAGNOSIS — F32A Depression, unspecified: Secondary | ICD-10-CM

## 2014-01-06 DIAGNOSIS — I10 Essential (primary) hypertension: Secondary | ICD-10-CM

## 2014-01-06 DIAGNOSIS — F329 Major depressive disorder, single episode, unspecified: Secondary | ICD-10-CM

## 2014-01-06 NOTE — Progress Notes (Signed)
  Subjective:    Patient here for follow-up of elevated blood pressure.  She is not exercising and is adherent to a low-salt diet.  Blood pressure is well controlled at home. Cardiac symptoms: none. Patient denies: chest pain, chest pressure/discomfort, claudication, dyspnea, exertional chest pressure/discomfort, fatigue, irregular heart beat, lower extremity edema, near-syncope, orthopnea, palpitations, paroxysmal nocturnal dyspnea, syncope and tachypnea. Cardiovascular risk factors: hypertension. Use of agents associated with hypertension: none. History of target organ damage: none.  She is also her to f/u depression.  She has been sick so she is not sure how med is working and would like to hold off adjusting it for another month.    The following portions of the patient's history were reviewed and updated as appropriate: allergies, current medications, past family history, past medical history, past social history, past surgical history and problem list.  Review of Systems Pertinent items are noted in HPI.     Objective:    BP 122/80  Pulse 97  Temp(Src) 98.6 F (37 C) (Oral)  Wt 211 lb 3.2 oz (95.8 kg)  SpO2 98% General appearance: alert, cooperative, appears stated age and no distress Head: Normocephalic, without obvious abnormality, atraumatic Throat: lips, mucosa, and tongue normal; teeth and gums normal Neck: no adenopathy, no carotid bruit, no JVD, supple, symmetrical, trachea midline and thyroid not enlarged, symmetric, no tenderness/mass/nodules Lungs: clear to auscultation bilaterally Heart: S1, S2 normal Extremities: extremities normal, atraumatic, no cyanosis or edema   psych-- not suicidal,  Pt has had a URI so she is not sure how med is working Assessment:    Hypertension, normal blood pressure . Evidence of target organ damage: none.    Plan:    Medication: no change. Dietary sodium restriction. Regular aerobic exercise. Follow up: 3 months and as needed.  1.  Depression Cont lexapro-- pt will call next month if she feels it needs to be increased F/u 3 months  2. Essential hypertension See above

## 2014-01-06 NOTE — Progress Notes (Signed)
Pre visit review using our clinic review tool, if applicable. No additional management support is needed unless otherwise documented below in the visit note. 

## 2014-01-06 NOTE — Patient Instructions (Signed)

## 2014-01-21 ENCOUNTER — Encounter: Payer: Self-pay | Admitting: Physician Assistant

## 2014-01-21 ENCOUNTER — Ambulatory Visit (INDEPENDENT_AMBULATORY_CARE_PROVIDER_SITE_OTHER): Payer: BC Managed Care – PPO | Admitting: Physician Assistant

## 2014-01-21 VITALS — BP 140/89 | HR 83 | Temp 98.8°F | Resp 18 | Ht 64.0 in | Wt 212.2 lb

## 2014-01-21 DIAGNOSIS — I10 Essential (primary) hypertension: Secondary | ICD-10-CM

## 2014-01-21 DIAGNOSIS — B9689 Other specified bacterial agents as the cause of diseases classified elsewhere: Secondary | ICD-10-CM

## 2014-01-21 DIAGNOSIS — J019 Acute sinusitis, unspecified: Secondary | ICD-10-CM

## 2014-01-21 MED ORDER — CHLORTHALIDONE 25 MG PO TABS: 25.0000 mg | ORAL_TABLET | Freq: Every day | ORAL | Status: DC

## 2014-01-21 MED ORDER — HYDROCOD POLST-CHLORPHEN POLST 10-8 MG/5ML PO LQCR: 5.0000 mL | Freq: Two times a day (BID) | ORAL | Status: DC | PRN

## 2014-01-21 MED ORDER — AZITHROMYCIN 250 MG PO TABS: ORAL_TABLET | ORAL | Status: DC

## 2014-01-21 NOTE — Progress Notes (Signed)
Pre visit review using our clinic review tool, if applicable. No additional management support is needed unless otherwise documented below in the visit note/SLS  

## 2014-01-21 NOTE — Progress Notes (Signed)
History of Present Illness: Katelyn Wilcox is a 38 y.o. female who present to the clinic today complaining of sinus pressure, sinus pain and nasal congestion x 2 weeks. Patient endorses bilateral ear pain, voice hoarseness.  Patient denies SOB, wheezing, tooth pain, recent travel or sick contact.   History: Past Medical History  Diagnosis Date  . Asthma   . Hypertension    Current outpatient prescriptions: albuterol (VENTOLIN HFA) 108 (90 BASE) MCG/ACT inhaler, Inhale 2 puffs into the lungs every 6 (six) hours as needed for wheezing., Disp: 18 g, Rfl: 0;  budesonide-formoterol (SYMBICORT) 80-4.5 MCG/ACT inhaler, Inhale 2 puffs into the lungs 2 (two) times daily. Needs office visit, Disp: 10.2 g, Rfl: 11;  chlorthalidone (HYGROTON) 25 MG tablet, Take 1 tablet (25 mg total) by mouth daily., Disp: 30 tablet, Rfl: 1 escitalopram (LEXAPRO) 10 MG tablet, Take 1 tablet (10 mg total) by mouth at bedtime., Disp: 30 tablet, Rfl: 2;  montelukast (SINGULAIR) 10 MG tablet, Take 1 tablet (10 mg total) by mouth at bedtime., Disp: 30 tablet, Rfl: 11;  azithromycin (ZITHROMAX Z-PAK) 250 MG tablet, Take 2 tablets on Day 1.  Then take 1 tablet daily., Disp: 6 tablet, Rfl: 0 chlorpheniramine-HYDROcodone (TUSSIONEX PENNKINETIC ER) 10-8 MG/5ML LQCR, Take 5 mLs by mouth every 12 (twelve) hours as needed., Disp: 115 mL, Rfl: 0 Allergies  Allergen Reactions  . Penicillins    Family History  Problem Relation Age of Onset  . Thyroid disease    . Thyroid cancer      Paternal cousin  . Hashimoto's thyroiditis Sister   . Stroke Mother   . Hypertension Mother   . Stroke Maternal Grandmother   . Stroke Maternal Grandfather   . Stroke Paternal Grandmother   . Dementia Paternal Grandfather   . Diabetes Father   . Hypertension Father   . Diabetes Paternal Aunt   . Stroke Maternal Uncle   . Aortic aneurysm     History   Social History  . Marital Status: Married    Spouse Name: N/A    Number of Children: N/A    . Years of Education: N/A   Occupational History  .      special place-- wigs , prosthetics   Social History Main Topics  . Smoking status: Never Smoker   . Smokeless tobacco: None  . Alcohol Use: Yes     Comment: rare  . Drug Use: No  . Sexual Activity:    Partners: Male   Other Topics Concern  . None   Social History Narrative   Exercise--no   Review of Systems: See HPI.  All other ROS are negative.  Physical Examination: BP 140/89 mmHg  Pulse 83  Temp(Src) 98.8 F (37.1 C) (Oral)  Resp 18  Ht 5\' 4"  (1.626 m)  Wt 212 lb 4 oz (96.276 kg)  BMI 36.41 kg/m2  SpO2 99%  LMP 01/17/2014  General appearance: alert, cooperative and appears stated age Head: Normocephalic, without obvious abnormality, atraumatic, sinuses tender to percussion Eyes: conjunctivae/corneas clear. PERRL, EOM's intact. Fundi benign. Ears: normal TM's and external ear canals both ears Nose: moderate congestion, turbinates swollen, sinus tenderness bilateral Throat: lips, mucosa, and tongue normal; teeth and gums normal Neck: no adenopathy, no carotid bruit, no JVD, supple, symmetrical, trachea midline and thyroid not enlarged, symmetric, no tenderness/mass/nodules Lungs: clear to auscultation bilaterally Chest wall: no tenderness  Assessment/Plan: Acute bacterial sinusitis Rx Azithromycin.  Increase fluids.  Rest.  Saline nasal spray.  Probiotic.  Mucinex as  directed.  Humidifier in bedroom. Tussionex for cough.  Call or return to clinic if symptoms are not improving.

## 2014-01-21 NOTE — Patient Instructions (Signed)
Please take antibiotic as directed.  Increase fluid intake.  Use Saline nasal spray.  Take a daily multivitamin. Use Tussionex as directed for cough.  Rest your voice.  Place a humidifier in the bedroom.  Please call or return clinic if symptoms are not improving.  Sinusitis Sinusitis is redness, soreness, and swelling (inflammation) of the paranasal sinuses. Paranasal sinuses are air pockets within the bones of your face (beneath the eyes, the middle of the forehead, or above the eyes). In healthy paranasal sinuses, mucus is able to drain out, and air is able to circulate through them by way of your nose. However, when your paranasal sinuses are inflamed, mucus and air can become trapped. This can allow bacteria and other germs to grow and cause infection. Sinusitis can develop quickly and last only a short time (acute) or continue over a long period (chronic). Sinusitis that lasts for more than 12 weeks is considered chronic.  CAUSES  Causes of sinusitis include:  Allergies.  Structural abnormalities, such as displacement of the cartilage that separates your nostrils (deviated septum), which can decrease the air flow through your nose and sinuses and affect sinus drainage.  Functional abnormalities, such as when the small hairs (cilia) that line your sinuses and help remove mucus do not work properly or are not present. SYMPTOMS  Symptoms of acute and chronic sinusitis are the same. The primary symptoms are pain and pressure around the affected sinuses. Other symptoms include:  Upper toothache.  Earache.  Headache.  Bad breath.  Decreased sense of smell and taste.  A cough, which worsens when you are lying flat.  Fatigue.  Fever.  Thick drainage from your nose, which often is green and may contain pus (purulent).  Swelling and warmth over the affected sinuses. DIAGNOSIS  Your caregiver will perform a physical exam. During the exam, your caregiver may:  Look in your nose for  signs of abnormal growths in your nostrils (nasal polyps).  Tap over the affected sinus to check for signs of infection.  View the inside of your sinuses (endoscopy) with a special imaging device with a light attached (endoscope), which is inserted into your sinuses. If your caregiver suspects that you have chronic sinusitis, one or more of the following tests may be recommended:  Allergy tests.  Nasal culture A sample of mucus is taken from your nose and sent to a lab and screened for bacteria.  Nasal cytology A sample of mucus is taken from your nose and examined by your caregiver to determine if your sinusitis is related to an allergy. TREATMENT  Most cases of acute sinusitis are related to a viral infection and will resolve on their own within 10 days. Sometimes medicines are prescribed to help relieve symptoms (pain medicine, decongestants, nasal steroid sprays, or saline sprays).  However, for sinusitis related to a bacterial infection, your caregiver will prescribe antibiotic medicines. These are medicines that will help kill the bacteria causing the infection.  Rarely, sinusitis is caused by a fungal infection. In theses cases, your caregiver will prescribe antifungal medicine. For some cases of chronic sinusitis, surgery is needed. Generally, these are cases in which sinusitis recurs more than 3 times per year, despite other treatments. HOME CARE INSTRUCTIONS   Drink plenty of water. Water helps thin the mucus so your sinuses can drain more easily.  Use a humidifier.  Inhale steam 3 to 4 times a day (for example, sit in the bathroom with the shower running).  Apply a warm, moist  washcloth to your face 3 to 4 times a day, or as directed by your caregiver.  Use saline nasal sprays to help moisten and clean your sinuses.  Take over-the-counter or prescription medicines for pain, discomfort, or fever only as directed by your caregiver. SEEK IMMEDIATE MEDICAL CARE IF:  You have  increasing pain or severe headaches.  You have nausea, vomiting, or drowsiness.  You have swelling around your face.  You have vision problems.  You have a stiff neck.  You have difficulty breathing. MAKE SURE YOU:   Understand these instructions.  Will watch your condition.  Will get help right away if you are not doing well or get worse. Document Released: 02/27/2005 Document Revised: 05/22/2011 Document Reviewed: 03/14/2011 [facility] Patient Information 2014 Katelyn Wilcox, [place].

## 2014-01-21 NOTE — Assessment & Plan Note (Signed)
Rx Azithromycin.  Increase fluids.  Rest.  Saline nasal spray.  Probiotic.  Mucinex as directed.  Humidifier in bedroom. Tussionex for cough.  Call or return to clinic if symptoms are not improving.

## 2014-04-08 ENCOUNTER — Telehealth: Payer: Self-pay | Admitting: *Deleted

## 2014-04-08 NOTE — Telephone Encounter (Signed)
Prior authorization for Symbicort initiated through [employer]. Awaiting determination. JG//CMA

## 2014-04-09 ENCOUNTER — Encounter: Payer: Self-pay | Admitting: Family Medicine

## 2014-04-09 ENCOUNTER — Ambulatory Visit (INDEPENDENT_AMBULATORY_CARE_PROVIDER_SITE_OTHER): Payer: Self-pay | Admitting: Family Medicine

## 2014-04-09 VITALS — BP 120/80 | HR 79 | Temp 99.1°F | Wt 208.8 lb

## 2014-04-09 DIAGNOSIS — F329 Major depressive disorder, single episode, unspecified: Secondary | ICD-10-CM

## 2014-04-09 DIAGNOSIS — J4520 Mild intermittent asthma, uncomplicated: Secondary | ICD-10-CM

## 2014-04-09 DIAGNOSIS — J45909 Unspecified asthma, uncomplicated: Secondary | ICD-10-CM

## 2014-04-09 DIAGNOSIS — F32A Depression, unspecified: Secondary | ICD-10-CM

## 2014-04-09 DIAGNOSIS — I10 Essential (primary) hypertension: Secondary | ICD-10-CM

## 2014-04-09 MED ORDER — CHLORTHALIDONE 25 MG PO TABS: 25.0000 mg | ORAL_TABLET | Freq: Every day | ORAL | Status: DC

## 2014-04-09 MED ORDER — ESCITALOPRAM OXALATE 10 MG PO TABS: 10.0000 mg | ORAL_TABLET | Freq: Every day | ORAL | Status: DC

## 2014-04-09 MED ORDER — BUDESONIDE-FORMOTEROL FUMARATE 80-4.5 MCG/ACT IN AERO: 2.0000 | INHALATION_SPRAY | Freq: Two times a day (BID) | RESPIRATORY_TRACT | Status: DC

## 2014-04-09 NOTE — Assessment & Plan Note (Signed)
Stable Refill symbicort and ventolin

## 2014-04-09 NOTE — Progress Notes (Signed)
Pre visit review using our clinic review tool, if applicable. No additional management support is needed unless otherwise documented below in the visit note. 

## 2014-04-09 NOTE — Assessment & Plan Note (Signed)
Stable refilll chlorthalidone

## 2014-04-09 NOTE — Assessment & Plan Note (Signed)
Stable, cont lexapro  

## 2014-04-09 NOTE — Progress Notes (Signed)
   Subjective:    Patient ID: Katelyn Wilcox, female    DOB: [birth year], 39 y.o.   MRN: [id number]  HPI  Patient here for depression, bp and asthma f/u.  Past Medical History  Diagnosis Date  . Asthma   . Hypertension     Review of Systems  Constitutional: Positive for fatigue. Negative for activity change, appetite change and unexpected weight " {Pchange.  Respiratory: Negative for cough and shortness of breath.   Cardiovascular: Negative for chest pain and palpitations.  Psychiatric/Behavioral: Negative for behavioral problems and dysphoric mood. The patient is not nervous/anxious.        Objective:    Physical Exam  Constitutional: She is oriented to person, place, and time. She appears well-developed and well-nourished. No distress.  Neck: Normal range of motion. Neck supple. No JVD present.  Cardiovascular: Normal rate, regular rhythm, normal heart sounds and intact distal pulses.   No murmur heard. Pulmonary/Chest: Effort normal and breath sounds normal. No respiratory distress. She has no wheezes. She has no rales.  Lymphadenopathy:    She has no cervical adenopathy.  Neurological: She is alert and oriented to person, place, and time.  Skin: She is not diaphoretic.  Psychiatric: She has a normal mood and affect. Her behavior is normal. Judgment and thought content normal.    BP 120/80 mmHg  Pulse 79  Temp(Src) 99.1 F (37.3 C) (Oral)  Wt 208 lb 12.8 oz (94.711 kg)  SpO2 98% Wt Readings from Last 3 Encounters:  04/09/14 208 lb 12.8 oz (94.711 kg)  01/21/14 212 lb 4 oz (96.276 kg)  01/06/14 211 lb 3.2 oz (95.8 kg)     Lab Results  Component Value Date   WBC 6.6 08/25/2013   HGB 10.6* 08/25/2013   HCT 33.4* 08/25/2013   PLT 331.0 08/25/2013   GLUCOSE 92 08/25/2013   CHOL 173 08/25/2013   TRIG 107.0 08/25/2013   HDL 51.30 08/25/2013   LDLCALC 100* 08/25/2013   ALT 13 08/25/2013   AST 16 08/25/2013   NA 136 08/25/2013   K 3.8 08/25/2013   CL 106  08/25/2013   CREATININE 0.9 08/25/2013   BUN 8 08/25/2013   CO2 24 08/25/2013   TSH 1.81 08/25/2013   MICROALBUR 1.2 08/25/2013    No results found.     Assessment & Plan:   Problem List Items Addressed This Visit    Asthma in adult    Stable Refill symbicort and ventolin      Relevant Medications   budesonide-formoterol (SYMBICORT) 80-4.5 MCG/ACT inhaler   Depression    Stable,  con't lexapro       Relevant Medications   escitalopram (LEXAPRO) tablet   HTN (hypertension)    Stable refilll chlorthalidone      Relevant Medications   chlorthalidone (HYGROTON) tablet    Other Visit Diagnoses    Asthma, chronic, unspecified asthma severity, uncomplicated    -  Primary    Relevant Medications    budesonide-formoterol (SYMBICORT) 80-4.5 MCG/ACT inhaler    Asthma, unspecified asthma severity, uncomplicated        Relevant Medications    budesonide-formoterol (SYMBICORT) 80-4.5 MCG/ACT inhaler        Loreen FreudYvonne Lowne, DO

## 2014-05-19 ENCOUNTER — Telehealth: Payer: Self-pay | Admitting: *Deleted

## 2014-05-19 ENCOUNTER — Telehealth: Payer: Self-pay | Admitting: Family Medicine

## 2014-05-19 NOTE — Telephone Encounter (Signed)
PA approved.

## 2014-05-19 NOTE — Telephone Encounter (Signed)
Caller name: Arvil ChacoHodgson, Lashae Relation to pt: self  Call back number: [phone] [facility]  [id number] - [place], [place] - [address] [facility] [address] [phone] (Phone) [phone] (Fax)       Reason for call:  Pt states budesonide-formoterol [facility](SYMBICORT) 80-4.5 MCG/ACT inhaler  [facility] [employer] member ID [id number]   Group # [id number] RX Bin#  [id number]

## 2014-05-19 NOTE — Telephone Encounter (Signed)
PA already in process

## 2014-05-19 NOTE — Telephone Encounter (Signed)
Prior authorization for Symbicort initiated. Awaiting determination. JG//CMA 

## 2014-08-28 ENCOUNTER — Telehealth: Payer: Self-pay | Admitting: Family Medicine

## 2014-08-28 MED ORDER — VALACYCLOVIR HCL 1 G PO TABS: 1000.0000 mg | ORAL_TABLET | Freq: Two times a day (BID) | ORAL | Status: DC

## 2014-08-28 NOTE — Telephone Encounter (Signed)
Caller name: Perlie Relation to pt: self Call back number: [phone] Pharmacy: [facility] on [address]  Reason for call:   Patient states that she is having an outbreak of cold sores and is requesting a refill for this

## 2014-08-28 NOTE — Telephone Encounter (Signed)
Rx faxed.    KP 

## 2015-02-16 ENCOUNTER — Other Ambulatory Visit: Payer: Self-pay | Admitting: Family Medicine

## 2015-06-15 ENCOUNTER — Other Ambulatory Visit: Payer: Self-pay | Admitting: Family Medicine

## 2015-06-15 NOTE — Telephone Encounter (Signed)
Symbicort sent to pharmacy. Pt was due for 1 year follow up with Dr Laury AxonLowne in January and is past due. Pt needs appt before further refills can be given.  Please call pt to schedule appt. Thanks!

## 2015-07-13 NOTE — Telephone Encounter (Signed)
Left message for patient to call the office to schedule follow up with Dr. Zola ButtonLowne-Chase

## 2015-08-30 ENCOUNTER — Telehealth: Payer: Self-pay | Admitting: Family Medicine

## 2015-08-30 MED ORDER — ALBUTEROL SULFATE HFA 108 (90 BASE) MCG/ACT IN AERS: 2.0000 | INHALATION_SPRAY | Freq: Four times a day (QID) | RESPIRATORY_TRACT | Status: DC | PRN

## 2015-08-30 MED ORDER — BUDESONIDE-FORMOTEROL FUMARATE 80-4.5 MCG/ACT IN AERO: INHALATION_SPRAY | RESPIRATORY_TRACT | Status: DC

## 2015-08-30 NOTE — Telephone Encounter (Signed)
°  Relationship to patient: Self  Can be reached: [phone]  [facility]  [facility] [id number] - [place], [place] - [address] [facility] [address] [phone] (Phone) [phone] (Fa        Reason for call: Request refills on SYMBICORT 80-4.5 MCG/ACT inhaler and albuterol (VENTOLIN HFA) 108 (90 BASE) MCG/ACT inhaler [[id number]][[id number]]

## 2015-08-30 NOTE — Telephone Encounter (Signed)
Pending apt 10/25/15. Rx faxed.     KP

## 2015-10-25 ENCOUNTER — Other Ambulatory Visit (HOSPITAL_COMMUNITY)
Admission: RE | Admit: 2015-10-25 | Discharge: 2015-10-25 | Disposition: A | Discharge status: Home / Self Care | Payer: BLUE CROSS/BLUE SHIELD | Source: Ambulatory Visit | Attending: Family Medicine | Admitting: Family Medicine

## 2015-10-25 ENCOUNTER — Encounter: Payer: Self-pay | Admitting: Family Medicine

## 2015-10-25 ENCOUNTER — Ambulatory Visit (INDEPENDENT_AMBULATORY_CARE_PROVIDER_SITE_OTHER): Payer: BLUE CROSS/BLUE SHIELD | Admitting: Family Medicine

## 2015-10-25 VITALS — BP 150/100 | HR 72 | Temp 97.6°F | Wt 212.0 lb

## 2015-10-25 DIAGNOSIS — I10 Essential (primary) hypertension: Secondary | ICD-10-CM | POA: Diagnosis not present

## 2015-10-25 DIAGNOSIS — J4520 Mild intermittent asthma, uncomplicated: Secondary | ICD-10-CM | POA: Diagnosis not present

## 2015-10-25 DIAGNOSIS — Z124 Encounter for screening for malignant neoplasm of cervix: Secondary | ICD-10-CM

## 2015-10-25 DIAGNOSIS — N926 Irregular menstruation, unspecified: Secondary | ICD-10-CM

## 2015-10-25 DIAGNOSIS — Z0000 Encounter for general adult medical examination without abnormal findings: Secondary | ICD-10-CM

## 2015-10-25 DIAGNOSIS — Z1151 Encounter for screening for human papillomavirus (HPV): Secondary | ICD-10-CM | POA: Insufficient documentation

## 2015-10-25 DIAGNOSIS — Z01419 Encounter for gynecological examination (general) (routine) without abnormal findings: Secondary | ICD-10-CM | POA: Insufficient documentation

## 2015-10-25 LAB — COMPREHENSIVE METABOLIC PANEL
ALT: 11 U/L (ref 0–35)
AST: 15 U/L (ref 0–37)
Albumin: 4.3 g/dL (ref 3.5–5.2)
Alkaline Phosphatase: 85 U/L (ref 39–117)
BILIRUBIN TOTAL: 0.9 mg/dL (ref 0.2–1.2)
BUN: 7 mg/dL (ref 6–23)
CO2: 26 mEq/L (ref 19–32)
Calcium: 9.1 mg/dL (ref 8.4–10.5)
Chloride: 102 mEq/L (ref 96–112)
Creatinine, Ser: 0.77 mg/dL (ref 0.40–1.20)
GFR: 87.97 mL/min (ref >60.00)
GLUCOSE: 93 mg/dL (ref 70–99)
Potassium: 3.8 mEq/L (ref 3.5–5.1)
Sodium: 135 mEq/L (ref 135–145)
Total Protein: 7.1 g/dL (ref 6.0–8.3)

## 2015-10-25 LAB — CBC WITH DIFFERENTIAL/PLATELET
BASOS ABS: 0.1 10*3/uL (ref 0.0–0.1)
Basophils Relative: 0.9 % (ref 0.0–3.0)
EOS ABS: 0.7 10*3/uL (ref 0.0–0.7)
EOS PCT: 8.9 % — AB (ref 0.0–5.0)
HCT: 32.1 % — ABNORMAL LOW (ref 36.0–46.0)
HEMOGLOBIN: 10.1 g/dL — AB (ref 12.0–15.0)
Lymphocytes Relative: 22.7 % (ref 12.0–46.0)
Lymphs Abs: 1.7 10*3/uL (ref 0.7–4.0)
MCHC: 31.6 g/dL (ref 30.0–36.0)
MCV: 76.9 fl — ABNORMAL LOW (ref 78.0–100.0)
MONO ABS: 0.6 10*3/uL (ref 0.1–1.0)
Monocytes Relative: 7.2 % (ref 3.0–12.0)
NEUTROS PCT: 60.3 % (ref 43.0–77.0)
Neutro Abs: 4.6 10*3/uL (ref 1.4–7.7)
Platelets: 424 10*3/uL — ABNORMAL HIGH (ref 150.0–400.0)
RBC: 4.17 Mil/uL (ref 3.87–5.11)
RDW: 17.6 % — ABNORMAL HIGH (ref 11.5–15.5)
WBC: 7.7 10*3/uL (ref 4.0–10.5)

## 2015-10-25 LAB — POCT URINALYSIS DIPSTICK
Bilirubin, UA: NEGATIVE
Glucose, UA: NEGATIVE
Ketones, UA: NEGATIVE
LEUKOCYTES UA: NEGATIVE
NITRITE UA: NEGATIVE
PH UA: 7
PROTEIN UA: NEGATIVE
RBC UA: NEGATIVE
Spec Grav, UA: 1.015
UROBILINOGEN UA: 0.2

## 2015-10-25 LAB — T3, FREE: T3 FREE: 2.8 pg/mL (ref 2.3–4.2)

## 2015-10-25 LAB — T4, FREE: Free T4: 0.95 ng/dL (ref 0.60–1.60)

## 2015-10-25 LAB — TSH: TSH: 3.03 u[IU]/mL (ref 0.35–4.50)

## 2015-10-25 LAB — LIPID PANEL
CHOL/HDL RATIO: 3
Cholesterol: 185 mg/dL (ref 0–200)
HDL: 57.5 mg/dL (ref >39.00)
LDL Cholesterol: 101 mg/dL — ABNORMAL HIGH (ref 0–99)
NonHDL: 127.37
Triglycerides: 130 mg/dL (ref 0.0–149.0)
VLDL: 26 mg/dL (ref 0.0–40.0)

## 2015-10-25 MED ORDER — BUDESONIDE-FORMOTEROL FUMARATE 80-4.5 MCG/ACT IN AERO: INHALATION_SPRAY | RESPIRATORY_TRACT | 2 refills | Status: DC

## 2015-10-25 MED ORDER — CHLORTHALIDONE 25 MG PO TABS: 25.0000 mg | ORAL_TABLET | Freq: Every day | ORAL | 5 refills | Status: DC

## 2015-10-25 NOTE — Progress Notes (Signed)
Subjective:     Katelyn Wilcox is a 40 y.o. female and is here for a comprehensive physical exam. The patient reports no problems.  Social History   Social History  . Marital status: Married    Spouse name: N/A  . Number of children: N/A  . Years of education: N/A   Occupational History  .      special place-- wigs , prosthetics   Social History Main Topics  . Smoking status: Never Smoker  . Smokeless tobacco: Not on file  . Alcohol use Yes     Comment: rare  . Drug use: No  . Sexual activity: Yes    Partners: Male   Other Topics Concern  . Not on file   Social History Narrative   Exercise--no   Health Maintenance  Topic Date Due  . INFLUENZA VACCINE  11/25/2015 (Originally 10/12/2015)  . HIV Screening  10/24/2016 (Originally 03/11/1990)  . PAP SMEAR  11/25/2016  . TETANUS/TDAP  08/26/2023    The following portions of the patient's history were reviewed and updated as appropriate: She  has a past medical history of Asthma and Hypertension. She  does not have any pertinent problems on file. She  has a past surgical history that includes Nasal septum surgery. Her family history includes Aortic dissection in her father; Dementia in her paternal grandfather; Diabetes in her father and paternal aunt; Ehlers-Danlos syndrome in her son; Hashimoto's thyroiditis in her sister; Hypertension in her father and mother; Stroke in her maternal grandfather, maternal grandmother, maternal uncle, mother, and paternal grandmother. She  reports that she has never smoked. She does not have any smokeless tobacco history on file. She reports that she drinks alcohol. She reports that she does not use drugs. She has a current medication list which includes the following prescription(s): albuterol, budesonide-formoterol, chlorthalidone, and valacyclovir. Current Outpatient Prescriptions on File Prior to Visit  Medication Sig Dispense Refill  . albuterol (VENTOLIN HFA) 108 (90 Base) MCG/ACT inhaler  Inhale 2 puffs into the lungs every 6 (six) hours as needed for wheezing or shortness of breath. 18 g 0  . budesonide-formoterol (SYMBICORT) 80-4.5 MCG/ACT inhaler INHALE 2 PUFFS INTO THE LUNGS TWICE DAILY. 10.2 g 2  . chlorthalidone (HYGROTON) 25 MG tablet Take 1 tablet (25 mg total) by mouth daily. 30 tablet 5  . valACYclovir (VALTREX) 1000 MG tablet Take 1 tablet (1,000 mg total) by mouth 2 (two) times daily. 20 tablet 5   No current facility-administered medications on file prior to visit.    She is allergic to penicillins..  Review of Systems.  Review of Systems  Constitutional: Negative for activity change, appetite change and fatigue.  HENT: Negative for hearing loss, congestion, tinnitus and ear discharge.  dentist [id number] Eyes: Negative for visual disturbance (see optho q1y -- vision corrected to 20/20 with glasses).  Respiratory: Negative for cough, chest tightness and shortness of breath.   Cardiovascular: Negative for chest pain, palpitations and leg swelling.  Gastrointestinal: Negative for abdominal pain, diarrhea, constipation and abdominal distention.  Genitourinary: Negative for urgency, frequency, decreased urine volume and difficulty urinating.  Musculoskeletal: Negative for back pain, arthralgias and gait problem.  Skin: Negative for color change, pallor and rash.  Neurological: Negative for dizziness, light-headedness, numbness and headaches.  Hematological: Negative for adenopathy. Does not bruise/bleed easily.  Psychiatric/Behavioral: Negative for suicidal ideas, confusion, sleep disturbance, self-injury, dysphoric mood, decreased concentration and agitation.       Objective:    BP (!) 150/100 (BP Location: Right  Arm, Patient Position: Sitting, Cuff Size: Normal)   Pulse 72   Temp 97.6 F (36.4 C) (Oral)   Wt 211 lb 15.7 oz (96.2 kg)   LMP 10/07/2015 (Exact Date)   SpO2 100%   BMI 36.39 kg/m  General appearance: alert, cooperative, appears stated age and no  distress Head: Normocephalic, without obvious abnormality, atraumatic Eyes: conjunctivae/corneas clear. PERRL, EOM's intact. Fundi benign. Ears: normal TM's and external ear canals both ears Nose: Nares normal. Septum midline. Mucosa normal. No drainage or sinus tenderness. Throat: lips, mucosa, and tongue normal; teeth and gums normal Neck: no adenopathy, no carotid bruit, no JVD, supple, symmetrical, trachea midline and thyroid not enlarged, symmetric, no tenderness/mass/nodules Back: symmetric, no curvature. ROM normal. No CVA tenderness. Lungs: clear to auscultation bilaterally Breasts: normal appearance, no masses or tenderness Heart: regular rate and rhythm, S1, S2 normal, no murmur, click, rub or gallop Abdomen: soft, non-tender; bowel sounds normal; no masses,  no organomegaly Pelvic: cervix normal in appearance, external genitalia normal, no adnexal masses or tenderness, no cervical motion tenderness, rectovaginal septum normal, uterus normal size, shape, and consistency, vagina normal without discharge and pap done, rectal heme neg brown stool Extremities: extremities normal, atraumatic, no cyanosis or edema Pulses: 2+ and symmetric Skin: Skin color, texture, turgor normal. No rashes or lesions Lymph nodes: Cervical, supraclavicular, and axillary nodes normal. Neurologic: Alert and oriented X 3, normal strength and tone. Normal symmetric reflexes. Normal coordination and gait    Assessment:    Healthy female exam.    Plan:    ghm utd Check labs See After Visit Summary for Counseling Recommendations    1. Preventative health care See above - Comprehensive metabolic panel - Lipid panel - CBC with Differential/Platelet - TSH - POCT urinalysis dipstick - Cytology - PAP  2. Irregular uterine bleeding  - US Pelvis Complete; Future - US Transvaginal Non-OB; Future - T3, free - T4, free  3. Essential hypertension Elevated today-- pt has not taken meds - chlorthalidone  (HYGROTON) 25 MG tablet; Take 1 tablet (25 mg total) by mouth daily.  Dispense: 30 tablet; Refill: 5  4. Asthma, mild intermittent, uncomplicated   - budesonide-formoterol (SYMBICORT) 80-4.5 MCG/ACT inhaler; INHALE 2 PUFFS INTO THE LUNGS TWICE DAILY.  Dispense: 10.2 g; Refill: 2  5. Screening for malignant neoplasm of cervix   - Cytology - PAP

## 2015-10-25 NOTE — Patient Instructions (Signed)
Preventive Care for Adults, Female A healthy lifestyle and preventive care can promote health and wellness. Preventive health guidelines for women include the following key practices.  A routine yearly physical is a good way to check with your health care provider about your health and preventive screening. It is a chance to share any concerns and updates on your health and to receive a thorough exam.  Visit your dentist for a routine exam and preventive care every 6 months. Brush your teeth twice a day and floss once a day. Good oral hygiene prevents tooth decay and gum disease.  The frequency of eye exams is based on your age, health, family medical history, use of contact lenses, and other factors. Follow your health care provider's recommendations for frequency of eye exams.  Eat a healthy diet. Foods like vegetables, fruits, whole grains, low-fat dairy products, and lean protein foods contain the nutrients you need without too many calories. Decrease your intake of foods high in solid fats, added sugars, and salt. Eat the right amount of calories for you.Get information about a proper diet from your health care provider, if necessary.  Regular physical exercise is one of the most important things you can do for your health. Most adults should get at least 150 minutes of moderate-intensity exercise (any activity that increases your heart rate and causes you to sweat) each week. In addition, most adults need muscle-strengthening exercises on 2 or more days a week.  Maintain a healthy weight. The body mass index (BMI) is a screening tool to identify possible weight problems. It provides an estimate of body fat based on height and weight. Your health care provider can find your BMI and can help you achieve or maintain a healthy weight.For adults 20 years and older:  A BMI below 18.5 is considered underweight.  A BMI of 18.5 to 24.9 is normal.  A BMI of 25 to 29.9 is considered overweight.  A  BMI of 30 and above is considered obese.  Maintain normal blood lipids and cholesterol levels by exercising and minimizing your intake of saturated fat. Eat a balanced diet with plenty of fruit and vegetables. Blood tests for lipids and cholesterol should begin at age 45 and be repeated every 5 years. If your lipid or cholesterol levels are high, you are over 50, or you are at high risk for heart disease, you may need your cholesterol levels checked more frequently.Ongoing high lipid and cholesterol levels should be treated with medicines if diet and exercise are not working.  If you smoke, find out from your health care provider how to quit. If you do not use tobacco, do not start.  Lung cancer screening is recommended for adults aged 45-80 years who are at high risk for developing lung cancer because of a history of smoking. A yearly low-dose CT scan of the lungs is recommended for people who have at least a 30-pack-year history of smoking and are a current smoker or have quit within the past 15 years. A pack year of smoking is smoking an average of 1 pack of cigarettes a day for 1 year (for example: 1 pack a day for 30 years or 2 packs a day for 15 years). Yearly screening should continue until the smoker has stopped smoking for at least 15 years. Yearly screening should be stopped for people who develop a health problem that would prevent them from having lung cancer treatment.  If you are pregnant, do not drink alcohol. If you are  breastfeeding, be very cautious about drinking alcohol. If you are not pregnant and choose to drink alcohol, do not have more than 1 drink per day. One drink is considered to be 12 ounces (355 mL) of beer, 5 ounces (148 mL) of wine, or 1.5 ounces (44 mL) of liquor.  Avoid use of street drugs. Do not share needles with anyone. Ask for help if you need support or instructions about stopping the use of drugs.  High blood pressure causes heart disease and increases the risk  of stroke. Your blood pressure should be checked at least every 1 to 2 years. Ongoing high blood pressure should be treated with medicines if weight loss and exercise do not work.  If you are 55-79 years old, ask your health care provider if you should take aspirin to prevent strokes.  Diabetes screening is done by taking a blood sample to check your blood glucose level after you have not eaten for a certain period of time (fasting). If you are not overweight and you do not have risk factors for diabetes, you should be screened once every 3 years starting at age 45. If you are overweight or obese and you are 40-70 years of age, you should be screened for diabetes every year as part of your cardiovascular risk assessment.  Breast cancer screening is essential preventive care for women. You should practice "breast self-awareness." This means understanding the normal appearance and feel of your breasts and may include breast self-examination. Any changes detected, no matter how small, should be reported to a health care provider. Women in their 20s and 30s should have a clinical breast exam (CBE) by a health care provider as part of a regular health exam every 1 to 3 years. After age 40, women should have a CBE every year. Starting at age 40, women should consider having a mammogram (breast X-ray test) every year. Women who have a family history of breast cancer should talk to their health care provider about genetic screening. Women at a high risk of breast cancer should talk to their health care providers about having an MRI and a mammogram every year.  Breast cancer gene (BRCA)-related cancer risk assessment is recommended for women who have family members with BRCA-related cancers. BRCA-related cancers include breast, ovarian, tubal, and peritoneal cancers. Having family members with these cancers may be associated with an increased risk for harmful changes (mutations) in the breast cancer genes BRCA1 and  BRCA2. Results of the assessment will determine the need for genetic counseling and BRCA1 and BRCA2 testing.  Your health care provider may recommend that you be screened regularly for cancer of the pelvic organs (ovaries, uterus, and vagina). This screening involves a pelvic examination, including checking for microscopic changes to the surface of your cervix (Pap test). You may be encouraged to have this screening done every 3 years, beginning at age 21.  For women ages 30-65, health care providers may recommend pelvic exams and Pap testing every 3 years, or they may recommend the Pap and pelvic exam, combined with testing for human papilloma virus (HPV), every 5 years. Some types of HPV increase your risk of cervical cancer. Testing for HPV may also be done on women of any age with unclear Pap test results.  Other health care providers may not recommend any screening for nonpregnant women who are considered low risk for pelvic cancer and who do not have symptoms. Ask your health care provider if a screening pelvic exam is right for   you.  If you have had past treatment for cervical cancer or a condition that could lead to cancer, you need Pap tests and screening for cancer for at least 20 years after your treatment. If Pap tests have been discontinued, your risk factors (such as having a new sexual partner) need to be reassessed to determine if screening should resume. Some women have medical problems that increase the chance of getting cervical cancer. In these cases, your health care provider may recommend more frequent screening and Pap tests.  Colorectal cancer can be detected and often prevented. Most routine colorectal cancer screening begins at the age of 50 years and continues through age 75 years. However, your health care provider may recommend screening at an earlier age if you have risk factors for colon cancer. On a yearly basis, your health care provider may provide home test kits to check  for hidden blood in the stool. Use of a small camera at the end of a tube, to directly examine the colon (sigmoidoscopy or colonoscopy), can detect the earliest forms of colorectal cancer. Talk to your health care provider about this at age 50, when routine screening begins. Direct exam of the colon should be repeated every 5-10 years through age 75 years, unless early forms of precancerous polyps or small growths are found.  People who are at an increased risk for hepatitis B should be screened for this virus. You are considered at high risk for hepatitis B if:  You were born in a country where hepatitis B occurs often. Talk with your health care provider about which countries are considered high risk.  Your parents were born in a high-risk country and you have not received a shot to protect against hepatitis B (hepatitis B vaccine).  You have HIV or AIDS.  You use needles to inject street drugs.  You live with, or have sex with, someone who has hepatitis B.  You get hemodialysis treatment.  You take certain medicines for conditions like cancer, organ transplantation, and autoimmune conditions.  Hepatitis C blood testing is recommended for all people born from 1945 through 1965 and any individual with known risks for hepatitis C.  Practice safe sex. Use condoms and avoid high-risk sexual practices to reduce the spread of sexually transmitted infections (STIs). STIs include gonorrhea, chlamydia, syphilis, trichomonas, herpes, HPV, and human immunodeficiency virus (HIV). Herpes, HIV, and HPV are viral illnesses that have no cure. They can result in disability, cancer, and death.  You should be screened for sexually transmitted illnesses (STIs) including gonorrhea and chlamydia if:  You are sexually active and are younger than 24 years.  You are older than 24 years and your health care provider tells you that you are at risk for this type of infection.  Your sexual activity has changed  since you were last screened and you are at an increased risk for chlamydia or gonorrhea. Ask your health care provider if you are at risk.  If you are at risk of being infected with HIV, it is recommended that you take a prescription medicine daily to prevent HIV infection. This is called preexposure prophylaxis (PrEP). You are considered at risk if:  You are sexually active and do not regularly use condoms or know the HIV status of your partner(s).  You take drugs by injection.  You are sexually active with a partner who has HIV.  Talk with your health care provider about whether you are at high risk of being infected with HIV. If   you choose to begin PrEP, you should first be tested for HIV. You should then be tested every 3 months for as long as you are taking PrEP.  Osteoporosis is a disease in which the bones lose minerals and strength with aging. This can result in serious bone fractures or breaks. The risk of osteoporosis can be identified using a bone density scan. Women ages 67 years and over and women at risk for fractures or osteoporosis should discuss screening with their health care providers. Ask your health care provider whether you should take a calcium supplement or vitamin D to reduce the rate of osteoporosis.  Menopause can be associated with physical symptoms and risks. Hormone replacement therapy is available to decrease symptoms and risks. You should talk to your health care provider about whether hormone replacement therapy is right for you.  Use sunscreen. Apply sunscreen liberally and repeatedly throughout the day. You should seek shade when your shadow is shorter than you. Protect yourself by wearing long sleeves, pants, a wide-brimmed hat, and sunglasses year round, whenever you are outdoors.  Once a month, do a whole body skin exam, using a mirror to look at the skin on your back. Tell your health care provider of new moles, moles that have irregular borders, moles that  are larger than a pencil eraser, or moles that have changed in shape or color.  Stay current with required vaccines (immunizations).  Influenza vaccine. All adults should be immunized every year.  Tetanus, diphtheria, and acellular pertussis (Td, Tdap) vaccine. Pregnant women should receive 1 dose of Tdap vaccine during each pregnancy. The dose should be obtained regardless of the length of time since the last dose. Immunization is preferred during the 27th-36th week of gestation. An adult who has not previously received Tdap or who does not know her vaccine status should receive 1 dose of Tdap. This initial dose should be followed by tetanus and diphtheria toxoids (Td) booster doses every 10 years. Adults with an unknown or incomplete history of completing a 3-dose immunization series with Td-containing vaccines should begin or complete a primary immunization series including a Tdap dose. Adults should receive a Td booster every 10 years.  Varicella vaccine. An adult without evidence of immunity to varicella should receive 2 doses or a second dose if she has previously received 1 dose. Pregnant females who do not have evidence of immunity should receive the first dose after pregnancy. This first dose should be obtained before leaving the health care facility. The second dose should be obtained 4-8 weeks after the first dose.  Human papillomavirus (HPV) vaccine. Females aged 13-26 years who have not received the vaccine previously should obtain the 3-dose series. The vaccine is not recommended for use in pregnant females. However, pregnancy testing is not needed before receiving a dose. If a female is found to be pregnant after receiving a dose, no treatment is needed. In that case, the remaining doses should be delayed until after the pregnancy. Immunization is recommended for any person with an immunocompromised condition through the age of 61 years if she did not get any or all doses earlier. During the  3-dose series, the second dose should be obtained 4-8 weeks after the first dose. The third dose should be obtained 24 weeks after the first dose and 16 weeks after the second dose.  Zoster vaccine. One dose is recommended for adults aged 30 years or older unless certain conditions are present.  Measles, mumps, and rubella (MMR) vaccine. Adults born  before 1957 generally are considered immune to measles and mumps. Adults born in 1957 or later should have 1 or more doses of MMR vaccine unless there is a contraindication to the vaccine or there is laboratory evidence of immunity to each of the three diseases. A routine second dose of MMR vaccine should be obtained at least 28 days after the first dose for students attending postsecondary schools, health care workers, or international travelers. People who received inactivated measles vaccine or an unknown type of measles vaccine during 1963-1967 should receive 2 doses of MMR vaccine. People who received inactivated mumps vaccine or an unknown type of mumps vaccine before 1979 and are at high risk for mumps infection should consider immunization with 2 doses of MMR vaccine. For females of childbearing age, rubella immunity should be determined. If there is no evidence of immunity, females who are not pregnant should be vaccinated. If there is no evidence of immunity, females who are pregnant should delay immunization until after pregnancy. Unvaccinated health care workers born before 1957 who lack laboratory evidence of measles, mumps, or rubella immunity or laboratory confirmation of disease should consider measles and mumps immunization with 2 doses of MMR vaccine or rubella immunization with 1 dose of MMR vaccine.  Pneumococcal 13-valent conjugate (PCV13) vaccine. When indicated, a person who is uncertain of his immunization history and has no record of immunization should receive the PCV13 vaccine. All adults 65 years of age and older should receive this  vaccine. An adult aged 19 years or older who has certain medical conditions and has not been previously immunized should receive 1 dose of PCV13 vaccine. This PCV13 should be followed with a dose of pneumococcal polysaccharide (PPSV23) vaccine. Adults who are at high risk for pneumococcal disease should obtain the PPSV23 vaccine at least 8 weeks after the dose of PCV13 vaccine. Adults older than 41 years of age who have normal immune system function should obtain the PPSV23 vaccine dose at least 1 year after the dose of PCV13 vaccine.  Pneumococcal polysaccharide (PPSV23) vaccine. When PCV13 is also indicated, PCV13 should be obtained first. All adults aged 65 years and older should be immunized. An adult younger than age 65 years who has certain medical conditions should be immunized. Any person who resides in a nursing home or long-term care facility should be immunized. An adult smoker should be immunized. People with an immunocompromised condition and certain other conditions should receive both PCV13 and PPSV23 vaccines. People with human immunodeficiency virus (HIV) infection should be immunized as soon as possible after diagnosis. Immunization during chemotherapy or radiation therapy should be avoided. Routine use of PPSV23 vaccine is not recommended for American Indians, [place] Natives, or people younger than 65 years unless there are medical conditions that require PPSV23 vaccine. When indicated, people who have unknown immunization and have no record of immunization should receive PPSV23 vaccine. One-time revaccination 5 years after the first dose of PPSV23 is recommended for people aged 19-64 years who have chronic kidney failure, nephrotic syndrome, asplenia, or immunocompromised conditions. People who received 1-2 doses of PPSV23 before age 65 years should receive another dose of PPSV23 vaccine at age 65 years or later if at least 5 years have passed since the previous dose. Doses of PPSV23 are not  needed for people immunized with PPSV23 at or after age 65 years.  Meningococcal vaccine. Adults with asplenia or persistent complement component deficiencies should receive 2 doses of quadrivalent meningococcal conjugate (MenACWY-D) vaccine. The doses should be obtained   at least 2 months apart. Microbiologists working with certain meningococcal bacteria, [place] recruits, people at risk during an outbreak, and people who travel to or live in countries with a high rate of meningitis should be immunized. A first-year college student up through age 34 years who is living in a residence hall should receive a dose if she did not receive a dose on or after her 16th birthday. Adults who have certain high-risk conditions should receive one or more doses of vaccine.  Hepatitis A vaccine. Adults who wish to be protected from this disease, have certain high-risk conditions, work with hepatitis A-infected animals, work in hepatitis A research labs, or travel to or work in countries with a high rate of hepatitis A should be immunized. Adults who were previously unvaccinated and who anticipate close contact with an international adoptee during the first 60 days after arrival in the [place] from a country with a high rate of hepatitis A should be immunized.  Hepatitis B vaccine. Adults who wish to be protected from this disease, have certain high-risk conditions, may be exposed to blood or other infectious body fluids, are household contacts or sex partners of hepatitis B positive people, are clients or workers in certain care facilities, or travel to or work in countries with a high rate of hepatitis B should be immunized.  Haemophilus influenzae type b (Hib) vaccine. A previously unvaccinated person with asplenia or sickle cell disease or having a scheduled splenectomy should receive 1 dose of Hib vaccine. Regardless of previous immunization, a recipient of a hematopoietic stem cell transplant should receive a  3-dose series 6-12 months after her successful transplant. Hib vaccine is not recommended for adults with HIV infection. Preventive Services / Frequency Ages 35 to 4 years  Blood pressure check.** / Every 3-5 years.  Lipid and cholesterol check.** / Every 5 years beginning at age 60.  Clinical breast exam.** / Every 3 years for women in their 71s and 10s.  BRCA-related cancer risk assessment.** / For women who have family members with a BRCA-related cancer (breast, ovarian, tubal, or peritoneal cancers).  Pap test.** / Every 2 years from ages 76 through 26. Every 3 years starting at age 61 through age 76 or 93 with a history of 3 consecutive normal Pap tests.  HPV screening.** / Every 3 years from ages 37 through ages 60 to 51 with a history of 3 consecutive normal Pap tests.  Hepatitis C blood test.** / For any individual with known risks for hepatitis C.  Skin self-exam. / Monthly.  Influenza vaccine. / Every year.  Tetanus, diphtheria, and acellular pertussis (Tdap, Td) vaccine.** / Consult your health care provider. Pregnant women should receive 1 dose of Tdap vaccine during each pregnancy. 1 dose of Td every 10 years.  Varicella vaccine.** / Consult your health care provider. Pregnant females who do not have evidence of immunity should receive the first dose after pregnancy.  HPV vaccine. / 3 doses over 6 months, if 93 and younger. The vaccine is not recommended for use in pregnant females. However, pregnancy testing is not needed before receiving a dose.  Measles, mumps, rubella (MMR) vaccine.** / You need at least 1 dose of MMR if you were born in 1957 or later. You may also need a 2nd dose. For females of childbearing age, rubella immunity should be determined. If there is no evidence of immunity, females who are not pregnant should be vaccinated. If there is no evidence of immunity, females who are  pregnant should delay immunization until after pregnancy.  Pneumococcal  13-valent conjugate (PCV13) vaccine.** / Consult your health care provider.  Pneumococcal polysaccharide (PPSV23) vaccine.** / 1 to 2 doses if you smoke cigarettes or if you have certain conditions.  Meningococcal vaccine.** / 1 dose if you are age 68 to 8 years and a Market researcher living in a residence hall, or have one of several medical conditions, you need to get vaccinated against meningococcal disease. You may also need additional booster doses.  Hepatitis A vaccine.** / Consult your health care provider.  Hepatitis B vaccine.** / Consult your health care provider.  Haemophilus influenzae type b (Hib) vaccine.** / Consult your health care provider. Ages 7 to 53 years  Blood pressure check.** / Every year.  Lipid and cholesterol check.** / Every 5 years beginning at age 25 years.  Lung cancer screening. / Every year if you are aged 11-80 years and have a 30-pack-year history of smoking and currently smoke or have quit within the past 15 years. Yearly screening is stopped once you have quit smoking for at least 15 years or develop a health problem that would prevent you from having lung cancer treatment.  Clinical breast exam.** / Every year after age 48 years.  BRCA-related cancer risk assessment.** / For women who have family members with a BRCA-related cancer (breast, ovarian, tubal, or peritoneal cancers).  Mammogram.** / Every year beginning at age 41 years and continuing for as long as you are in good health. Consult with your health care provider.  Pap test.** / Every 3 years starting at age 65 years through age 37 or 70 years with a history of 3 consecutive normal Pap tests.  HPV screening.** / Every 3 years from ages 72 years through ages 60 to 40 years with a history of 3 consecutive normal Pap tests.  Fecal occult blood test (FOBT) of stool. / Every year beginning at age 21 years and continuing until age 5 years. You may not need to do this test if you get  a colonoscopy every 10 years.  Flexible sigmoidoscopy or colonoscopy.** / Every 5 years for a flexible sigmoidoscopy or every 10 years for a colonoscopy beginning at age 35 years and continuing until age 48 years.  Hepatitis C blood test.** / For all people born from [birth year] through 1965 and any individual with known risks for hepatitis C.  Skin self-exam. / Monthly.  Influenza vaccine. / Every year.  Tetanus, diphtheria, and acellular pertussis (Tdap/Td) vaccine.** / Consult your health care provider. Pregnant women should receive 1 dose of Tdap vaccine during each pregnancy. 1 dose of Td every 10 years.  Varicella vaccine.** / Consult your health care provider. Pregnant females who do not have evidence of immunity should receive the first dose after pregnancy.  Zoster vaccine.** / 1 dose for adults aged 30 years or older.  Measles, mumps, rubella (MMR) vaccine.** / You need at least 1 dose of MMR if you were born in 1957 or later. You may also need a second dose. For females of childbearing age, rubella immunity should be determined. If there is no evidence of immunity, females who are not pregnant should be vaccinated. If there is no evidence of immunity, females who are pregnant should delay immunization until after pregnancy.  Pneumococcal 13-valent conjugate (PCV13) vaccine.** / Consult your health care provider.  Pneumococcal polysaccharide (PPSV23) vaccine.** / 1 to 2 doses if you smoke cigarettes or if you have certain conditions.  Meningococcal vaccine.** /  Consult your health care provider.  Hepatitis A vaccine.** / Consult your health care provider.  Hepatitis B vaccine.** / Consult your health care provider.  Haemophilus influenzae type b (Hib) vaccine.** / Consult your health care provider. Ages 64 years and over  Blood pressure check.** / Every year.  Lipid and cholesterol check.** / Every 5 years beginning at age 23 years.  Lung cancer screening. / Every year if you  are aged 16-80 years and have a 30-pack-year history of smoking and currently smoke or have quit within the past 15 years. Yearly screening is stopped once you have quit smoking for at least 15 years or develop a health problem that would prevent you from having lung cancer treatment.  Clinical breast exam.** / Every year after age 74 years.  BRCA-related cancer risk assessment.** / For women who have family members with a BRCA-related cancer (breast, ovarian, tubal, or peritoneal cancers).  Mammogram.** / Every year beginning at age 44 years and continuing for as long as you are in good health. Consult with your health care provider.  Pap test.** / Every 3 years starting at age 58 years through age 22 or 39 years with 3 consecutive normal Pap tests. Testing can be stopped between 65 and 70 years with 3 consecutive normal Pap tests and no abnormal Pap or HPV tests in the past 10 years.  HPV screening.** / Every 3 years from ages 64 years through ages 70 or 61 years with a history of 3 consecutive normal Pap tests. Testing can be stopped between 65 and 70 years with 3 consecutive normal Pap tests and no abnormal Pap or HPV tests in the past 10 years.  Fecal occult blood test (FOBT) of stool. / Every year beginning at age 40 years and continuing until age 27 years. You may not need to do this test if you get a colonoscopy every 10 years.  Flexible sigmoidoscopy or colonoscopy.** / Every 5 years for a flexible sigmoidoscopy or every 10 years for a colonoscopy beginning at age 7 years and continuing until age 32 years.  Hepatitis C blood test.** / For all people born from [birth year] through 1966 and any individual with known risks for hepatitis C.  Osteoporosis screening.** / A one-time screening for women ages 30 years and over and women at risk for fractures or osteoporosis.  Skin self-exam. / Monthly.  Influenza vaccine. / Every year.  Tetanus, diphtheria, and acellular pertussis (Tdap/Td)  vaccine.** / 1 dose of Td every 10 years.  Varicella vaccine.** / Consult your health care provider.  Zoster vaccine.** / 1 dose for adults aged 35 years or older.  Pneumococcal 13-valent conjugate (PCV13) vaccine.** / Consult your health care provider.  Pneumococcal polysaccharide (PPSV23) vaccine.** / 1 dose for all adults aged 46 years and older.  Meningococcal vaccine.** / Consult your health care provider.  Hepatitis A vaccine.** / Consult your health care provider.  Hepatitis B vaccine.** / Consult your health care provider.  Haemophilus influenzae type b (Hib) vaccine.** / Consult your health care provider. ** Family history and personal history of risk and conditions may change your health care provider's recommendations.   This information is not intended to replace advice given to you by your health care provider. Make sure you discuss any questions you have with your health care provider.   Document Released: 04/25/2001 Document Revised: 03/20/2014 Document Reviewed: 07/25/2010 Elsevier Interactive Patient Education [employer].

## 2015-10-25 NOTE — Progress Notes (Signed)
Pre visit review using our clinic review tool, if applicable. No additional management support is needed unless otherwise documented below in the visit note. 

## 2015-10-27 LAB — CYTOLOGY - PAP

## 2016-01-21 ENCOUNTER — Other Ambulatory Visit: Payer: Self-pay | Admitting: Family Medicine

## 2016-01-21 DIAGNOSIS — Z1231 Encounter for screening mammogram for malignant neoplasm of breast: Secondary | ICD-10-CM

## 2016-02-07 ENCOUNTER — Encounter: Payer: Self-pay | Admitting: Medical

## 2016-02-07 ENCOUNTER — Ambulatory Visit (INDEPENDENT_AMBULATORY_CARE_PROVIDER_SITE_OTHER): Payer: BLUE CROSS/BLUE SHIELD | Admitting: Medical

## 2016-02-07 VITALS — BP 116/78 | HR 67 | Temp 97.8°F | Ht 64.0 in | Wt 211.0 lb

## 2016-02-07 DIAGNOSIS — R059 Cough, unspecified: Secondary | ICD-10-CM

## 2016-02-07 DIAGNOSIS — J0100 Acute maxillary sinusitis, unspecified: Secondary | ICD-10-CM | POA: Diagnosis not present

## 2016-02-07 DIAGNOSIS — J209 Acute bronchitis, unspecified: Secondary | ICD-10-CM | POA: Diagnosis not present

## 2016-02-07 DIAGNOSIS — R062 Wheezing: Secondary | ICD-10-CM | POA: Diagnosis not present

## 2016-02-07 DIAGNOSIS — R05 Cough: Secondary | ICD-10-CM

## 2016-02-07 MED ORDER — AZITHROMYCIN 250 MG PO TABS: ORAL_TABLET | ORAL | 0 refills | Status: DC

## 2016-02-07 MED ORDER — FLUTICASONE PROPIONATE 50 MCG/ACT NA SUSP: 2.0000 | Freq: Every day | NASAL | 1 refills | Status: DC

## 2016-02-07 MED ORDER — BENZONATATE 100 MG PO CAPS: 100.0000 mg | ORAL_CAPSULE | Freq: Three times a day (TID) | ORAL | 0 refills | Status: DC | PRN

## 2016-02-07 NOTE — Patient Instructions (Addendum)
You appear to have bronchitis and sinusitis. Rest hydrate and tylenol for fever. I am prescribing cough medicine benzonatate, and azithromycin antibiotic. For your nasal congestion rx flonase  You should gradually get better. If not then notify us and would recommend a chest xray.  If your wheezing worsens despite use symbicort and albuterol then can make 5 day prednisone taper available(please call us and let us know if you feel you need).Presently pt deferred rx of prednisone.  Follow up in 7-10 days or as needed

## 2016-02-07 NOTE — Progress Notes (Signed)
Subjective:    Patient ID: Katelyn Wilcox, female    DOB: 07-10-1975, 40 y.o.   MRN: [id number]  HPI  Pt in for recent nasal congestion for about 10 days. Then over weekend got sinus pressure with pnd. When blows her nose get thick brown mucous. Pt is coughing during the day a lot. Pt is wheezing very minimal.(pt has sybmicort and albuterol). No use of albuterol recently.  Cough mostly from pnd.   No fever, no chills or sweats.   LMP- one week.  Review of Systems  Constitutional: Negative for chills, fatigue and fever.  HENT: Positive for congestion and sinus pressure. Negative for ear pain, sinus pain, sneezing and sore throat.   Respiratory: Positive for cough and wheezing.        Wheezing very minimal recently.  Cardiovascular: Negative for chest pain and palpitations.  Gastrointestinal: Negative for abdominal pain, constipation, diarrhea and nausea.  Musculoskeletal: Negative for back pain.  Skin: Negative for rash.  Neurological: Negative for dizziness, syncope, weakness, numbness and headaches.  Hematological: Negative for adenopathy.  Psychiatric/Behavioral: Negative for behavioral problems and confusion.    Past Medical History:  Diagnosis Date  . Asthma   . Hypertension      Social History   Social History  . Marital status: Married    Spouse name: N/A  . Number of children: N/A  . Years of education: N/A   Occupational History  .      special place-- wigs , prosthetics   Social History Main Topics  . Smoking status: Never Smoker  . Smokeless tobacco: Not on file  . Alcohol use Yes     Comment: rare  . Drug use: No  . Sexual activity: Yes    Partners: Male   Other Topics Concern  . Not on file   Social History Narrative   Exercise--no    Past Surgical History:  Procedure Laterality Date  . NASAL SEPTUM SURGERY      Family History  Problem Relation Age of Onset  . Stroke Mother   . Hypertension Mother   . Diabetes Father   .  Hypertension Father   . Aortic dissection Father   . Thyroid disease    . Thyroid cancer      Paternal cousin  . Aortic aneurysm    . Hashimoto's thyroiditis Sister   . Stroke Maternal Grandmother   . Stroke Maternal Grandfather   . Stroke Paternal Grandmother   . Dementia Paternal Grandfather   . Diabetes Paternal Aunt   . Stroke Maternal Uncle   . Ehlers-Danlos syndrome Son     Allergies  Allergen Reactions  . Penicillins     Current Outpatient Prescriptions on File Prior to Visit  Medication Sig Dispense Refill  . albuterol (VENTOLIN HFA) 108 (90 Base) MCG/ACT inhaler Inhale 2 puffs into the lungs every 6 (six) hours as needed for wheezing or shortness of breath. 18 g 0  . budesonide-formoterol (SYMBICORT) 80-4.5 MCG/ACT inhaler INHALE 2 PUFFS INTO THE LUNGS TWICE DAILY. 10.2 g 2  . chlorthalidone (HYGROTON) 25 MG tablet Take 1 tablet (25 mg total) by mouth daily. 30 tablet 5  . valACYclovir (VALTREX) 1000 MG tablet Take 1 tablet (1,000 mg total) by mouth 2 (two) times daily. 20 tablet 5   No current facility-administered medications on file prior to visit.     BP 116/78 (BP Location: Left Arm, Patient Position: Sitting, Cuff Size: Normal)   Pulse 67   Temp 97.8 F (36.6  C) (Oral)   Ht 5\' 4"  (1.626 m)   Wt 211 lb (95.7 kg)   LMP 01/31/2016   SpO2 97%   BMI 36.22 kg/m       Objective:   Physical Exam   General  Mental Status - Alert. General Appearance - Well groomed. Not in acute distress.  Skin Rashes- No Rashes.  HEENT Head- Normal. Ear Auditory Canal - Left- Normal. Right - Normal.Tympanic Membrane- Left- Normal. Right- Normal. Eye Sclera/Conjunctiva- Left- Normal. Right- Normal. Nose & Sinuses Nasal Mucosa- Left-  Boggy and Congested. Right-  Boggy and  Congested.Bilateral maxillary and frontal sinus pressure. Mouth & Throat Lips: Upper Lip- Normal: no dryness, cracking, pallor, cyanosis, or vesicular eruption. Lower Lip-Normal: no dryness,  cracking, pallor, cyanosis or vesicular eruption. Buccal Mucosa- Bilateral- No Aphthous ulcers. Oropharynx- No Discharge or Erythema. Tonsils: Characteristics- Bilateral- No Erythema or Congestion. Size/Enlargement- Bilateral- No enlargement. Discharge- bilateral-None.  Neck Neck- Supple. No Masses.   Chest and Lung Exam Auscultation: Breath Sounds:-Clear even and unlabored.  Cardiovascular Auscultation:Rythm- Regular, rate and rhythm. Murmurs & Other Heart Sounds:Ausculatation of the heart reveal- No Murmurs.  Lymphatic Head & Neck General Head & Neck Lymphatics: Bilateral: Description- No Localized lymphadenopathy.      Assessment & Plan:   You appear to have bronchitis and sinusitis. Rest hydrate and tylenol for fever. I am prescribing cough medicine benzonatate, and azithromycin antibiotic. For your nasal congestion rx flonase  You should gradually get better. If not then notify us and would recommend a chest xray.  If your wheezing worsens despite use symbicort and albuterol then can make 5 day prednisone taper available(please call us and let us know if you feel you need). Presently pt deferred rx of prednisone.  Follow up in 7-10 days or as needed

## 2016-02-14 ENCOUNTER — Ambulatory Visit (HOSPITAL_BASED_OUTPATIENT_CLINIC_OR_DEPARTMENT_OTHER)
Admission: RE | Admit: 2016-02-14 | Discharge: 2016-02-14 | Disposition: A | Discharge status: Home / Self Care | Payer: BLUE CROSS/BLUE SHIELD | Source: Ambulatory Visit | Attending: Family Medicine | Admitting: Family Medicine

## 2016-02-14 ENCOUNTER — Other Ambulatory Visit: Payer: Self-pay | Admitting: Family Medicine

## 2016-02-14 ENCOUNTER — Ambulatory Visit (HOSPITAL_BASED_OUTPATIENT_CLINIC_OR_DEPARTMENT_OTHER): Payer: BLUE CROSS/BLUE SHIELD

## 2016-02-14 DIAGNOSIS — N859 Noninflammatory disorder of uterus, unspecified: Secondary | ICD-10-CM | POA: Diagnosis not present

## 2016-02-14 DIAGNOSIS — Z1231 Encounter for screening mammogram for malignant neoplasm of breast: Secondary | ICD-10-CM | POA: Diagnosis not present

## 2016-02-14 DIAGNOSIS — N858 Other specified noninflammatory disorders of uterus: Secondary | ICD-10-CM

## 2016-02-14 DIAGNOSIS — N926 Irregular menstruation, unspecified: Secondary | ICD-10-CM

## 2016-02-15 ENCOUNTER — Telehealth: Payer: Self-pay

## 2016-02-15 DIAGNOSIS — R9389 Abnormal findings on diagnostic imaging of other specified body structures: Secondary | ICD-10-CM

## 2016-02-15 NOTE — Telephone Encounter (Signed)
-----   Message from Donato SchultzYvonne R Lowne Chase, [place] sent at 02/14/2016 10:32 PM EST ----- An abnormality on us -- unsure of what it is We need asap referral to gyn---I would like referral to be done before you call pt so we can give her the appointment also

## 2016-02-15 NOTE — Telephone Encounter (Signed)
Referral for gyn entered, per providers request. LB

## 2016-02-21 ENCOUNTER — Ambulatory Visit (INDEPENDENT_AMBULATORY_CARE_PROVIDER_SITE_OTHER): Payer: BLUE CROSS/BLUE SHIELD | Admitting: Gynecology

## 2016-02-21 ENCOUNTER — Encounter: Payer: Self-pay | Admitting: Gynecology

## 2016-02-21 VITALS — BP 134/80 | Ht 64.0 in | Wt 214.0 lb

## 2016-02-21 DIAGNOSIS — N920 Excessive and frequent menstruation with regular cycle: Secondary | ICD-10-CM

## 2016-02-21 NOTE — Patient Instructions (Signed)
Follow up for ultrasound as scheduled 

## 2016-02-21 NOTE — Progress Notes (Signed)
    Katelyn Wilcox February 04, 1976 [id number]        40 y.o.  G3P0003 new patient referred from Dr. Laury AxonLowne in reference to worsening menses where her periods are getting heavier lasting 5-7 days with passage of clots. Recent hemoglobin 10. Also notes that her menses are getting closer together every 3 weeks instead of every 4 weeks. No intermenstrual bleeding. Recent ultrasound showed 1.8 x 1.5 x 1.6 cm posterior body mass which may be intramural but may be arising from the endometrium. Also questionable septated uterus. 3 uncomplicated term pregnancies noted. Using condoms for contraception. Is up-to-date with her annual exam to include Pap smear 10/2015 and mammography 02/2016.  Past medical history,surgical history, problem list, medications, allergies, family history and social history were all reviewed and documented in the EPIC chart.  Directed ROS with pertinent positives and negatives documented in the history of present illness/assessment and plan.  Exam: Bari MantisKim Alexis assistant Vitals:   02/21/16 0903  BP: 134/80  Weight: 214 lb (97.1 kg)  Height: 5\' 4"  (1.626 m)   General appearance:  Normal Abdomen soft nontender without masses guarding rebound Pelvic external BUS vagina normal. Cervix normal. Uterus normal size midline mobile nontender. Adnexa without masses or tenderness.  Assessment/Plan:  40 y.o. G3P0003 with worsening menorrhagia and ultrasound showing probable leiomyoma questionable submucous component. Also questionable septated uterus. Recommend sonohysterogram for better definition and patient will schedule follow up for this. I reviewed possibilities with her as well as possible treatment options to include hormonal manipulation, [name], hysteroscopy with resection of any intracavitary abnormalities, hysterectomy all reviewed. Patient will follow up for the sonohysterogram and will go from there.    [name],[name] MD, 9:33 AM 02/21/2016

## 2016-03-20 ENCOUNTER — Other Ambulatory Visit: Payer: Self-pay | Admitting: Gynecology

## 2016-03-20 DIAGNOSIS — N939 Abnormal uterine and vaginal bleeding, unspecified: Secondary | ICD-10-CM

## 2016-03-21 ENCOUNTER — Telehealth: Payer: Self-pay | Admitting: *Deleted

## 2016-03-21 NOTE — Telephone Encounter (Signed)
Per Barth KirksFabian [facility] covered at $20 copay Ref #[id number] [facility]

## 2016-04-03 ENCOUNTER — Other Ambulatory Visit: Payer: BLUE CROSS/BLUE SHIELD

## 2016-04-03 ENCOUNTER — Ambulatory Visit: Payer: BLUE CROSS/BLUE SHIELD | Admitting: Gynecology

## 2016-04-25 ENCOUNTER — Other Ambulatory Visit: Payer: Self-pay | Admitting: Family Medicine

## 2016-04-25 DIAGNOSIS — J4520 Mild intermittent asthma, uncomplicated: Secondary | ICD-10-CM

## 2016-05-03 ENCOUNTER — Ambulatory Visit (INDEPENDENT_AMBULATORY_CARE_PROVIDER_SITE_OTHER): Payer: BLUE CROSS/BLUE SHIELD | Admitting: Gynecology

## 2016-05-03 ENCOUNTER — Encounter: Payer: Self-pay | Admitting: Gynecology

## 2016-05-03 ENCOUNTER — Other Ambulatory Visit: Payer: Self-pay | Admitting: Gynecology

## 2016-05-03 ENCOUNTER — Ambulatory Visit (INDEPENDENT_AMBULATORY_CARE_PROVIDER_SITE_OTHER): Payer: BLUE CROSS/BLUE SHIELD

## 2016-05-03 VITALS — BP 120/74

## 2016-05-03 DIAGNOSIS — N920 Excessive and frequent menstruation with regular cycle: Secondary | ICD-10-CM | POA: Diagnosis not present

## 2016-05-03 DIAGNOSIS — N839 Noninflammatory disorder of ovary, fallopian tube and broad ligament, unspecified: Secondary | ICD-10-CM | POA: Diagnosis not present

## 2016-05-03 DIAGNOSIS — N921 Excessive and frequent menstruation with irregular cycle: Secondary | ICD-10-CM

## 2016-05-03 DIAGNOSIS — N838 Other noninflammatory disorders of ovary, fallopian tube and broad ligament: Secondary | ICD-10-CM

## 2016-05-03 DIAGNOSIS — N939 Abnormal uterine and vaginal bleeding, unspecified: Secondary | ICD-10-CM | POA: Diagnosis not present

## 2016-05-03 NOTE — Patient Instructions (Signed)
Office will call you with the biopsy results 

## 2016-05-03 NOTE — Progress Notes (Addendum)
    Katelyn ChacoJamela Maille Nov 09, 1975 [id number]        41 y.o.  G3P0003 presents for sonohysterogram. History of worsening menorrhagia lasting 5-7 days with passage of clots. Recent hemoglobin 10. Also menses are getting closer every 3 weeks. Ultrasound in December suggested possible septate uterus with small myomas.  Past medical history,surgical history, problem list, medications, allergies, family history and social history were all reviewed and documented in the EPIC chart.  Directed ROS with pertinent positives and negatives documented in the history of present illness/assessment and plan.  Exam: Pam [place] assistant Vitals:   05/03/16 1533  BP: 120/74   General appearance:  Normal Abdomen soft nontender without masses guarding rebound Pelvic external BUS vagina normal. Cervix normal. Uterus grossly normal midline mobile nontender. Adnexa without masses or tenderness.  Ultrasound transvaginal and transabdominal shows uterus to be retroverted. 2 small myomas measured at 20 mm and 18 mm. R endometrium appears subseptated with right endometrium 10 mm and left endometrium 10 mm. Right ovary with low-level internal echo mass 16 x 16 mm area second area of low level internal echo mass 19 x 16 mm. Left ovary is normal. cul-de-sac negative  Sonohysterogram was performed, sterile technique, single-tooth tenaculum anterior lip stabilization, easy catheter introduction with good distention and no abnormalities noted. No clear septum noted with good distention of the cavity. Slight arcuate appearance. Endometrial sample taken. Patient tolerated well.  Assessment/Plan:  41 y.o. G3P0003  with menorrhagia. Several small myomas. Probable arcuate appearance of the uterus with no clear septum on sonohysterogram. Patient will follow up for biopsy results. Options for management were reviewed to include hormonal manipulation to include combination birth control pills or progesterone only. Lysteda also discussed.  She does have a strong family history of stroke to include several relatives in the late 6740s. Risks of the above reviewed to include increased risk of clotting with stroke heart attack DVT. [name] for contraception as well as menstrual suppression. Endometrial ablation reviewed noting she is using condoms for contraception and the issues of failed contraception with pregnancy being dangerous situation after ablation. Lastly hysterectomy was discussed as an alternative. At this point the patient's think [name]. Will further discuss after biopsy results. I did discuss the insertional process and the risks to include infection, perforation or migration requiring surgery to remove and failure with pregnancy.  Patient does have 2 small cystic areas in the right ovary noted. These were not present at her December ultrasound consistent with physiologic changes. At this point do not feel follow up necessary.    [name],Carlotta Telfair P MD, 3:50 PM 05/03/2016

## 2016-07-12 ENCOUNTER — Ambulatory Visit (INDEPENDENT_AMBULATORY_CARE_PROVIDER_SITE_OTHER): Payer: BLUE CROSS/BLUE SHIELD | Admitting: Gynecology

## 2016-07-12 ENCOUNTER — Encounter: Payer: Self-pay | Admitting: Gynecology

## 2016-07-12 VITALS — BP 122/76

## 2016-07-12 DIAGNOSIS — Z30430 Encounter for insertion of intrauterine contraceptive device: Secondary | ICD-10-CM | POA: Diagnosis not present

## 2016-07-12 HISTORY — PX: INTRAUTERINE DEVICE INSERTION: SHX323

## 2016-07-12 NOTE — Patient Instructions (Signed)

## 2016-07-12 NOTE — Progress Notes (Signed)
    Katelyn Wilcox [birth year] [id number]        41 y.o.  G3P0003  presents for [name] placement. She has read through the booklet, has no contraindications and signed the consent form. She currently is on a normal menses.  She does appear to have an arcuate appearance on ultrasound but does not appear to have any significant septation. I did review with her that this potentially cause malplacement and whether this would affect failure risk reviewed. The insertional process was reviewed with her as well as the risks to include infection, either immediate or long-term, uterine perforation or migration requiring surgery to remove, other complications such as pain, hormonal side effects, infertility and possibility of failure with subsequent pregnancy.   Exam with Kennon Portela assistant Vitals:   07/12/16 1135  BP: 122/76    Pelvic: External BUS vagina normal. Cervix normal with light menses flow. Uterus axial normal size shape contour midline mobile nontender. Adnexa without masses or tenderness.  Procedure: The cervix was cleansed with Betadine, anterior lip grasped with a single-tooth tenaculum, the uterus was sounded and a [name] was placed according to manufacturer's recommendations without difficulty. The strings were trimmed. The patient tolerated well and will follow up in one month for a postinsertional check.  Lot number:  Allison Quarry MD, 11:48 AM 07/12/2016

## 2016-07-13 ENCOUNTER — Encounter: Payer: Self-pay | Admitting: Gynecology

## 2016-07-25 ENCOUNTER — Ambulatory Visit (INDEPENDENT_AMBULATORY_CARE_PROVIDER_SITE_OTHER): Payer: BLUE CROSS/BLUE SHIELD | Admitting: Family Medicine

## 2016-07-25 ENCOUNTER — Encounter: Payer: Self-pay | Admitting: Family Medicine

## 2016-07-25 VITALS — BP 130/89 | HR 76 | Temp 98.3°F | Resp 16 | Ht 64.0 in | Wt 212.4 lb

## 2016-07-25 DIAGNOSIS — B001 Herpesviral vesicular dermatitis: Secondary | ICD-10-CM | POA: Diagnosis not present

## 2016-07-25 MED ORDER — VALACYCLOVIR HCL 1 G PO TABS: 1000.0000 mg | ORAL_TABLET | Freq: Three times a day (TID) | ORAL | 5 refills | Status: DC

## 2016-07-25 NOTE — Patient Instructions (Addendum)
If you feel a cold sore coming on-- take 2 tab and       Cold Sore A cold sore, also called a fever blister, is a skin infection that causes small, fluid-filled sores to form inside of the mouth or on the lips, gums, nose, chin, or cheeks. Cold sores can spread to other parts of the body, such as the eyes or fingers. In some people with other medical conditions, cold sores can spread to multiple other body sites, including the genitals. Cold sores can be spread or passed from person to person (contagious) until the sores crust over completely. What are the causes? Cold sores are caused by the herpes simplex virus (HSV-1). HSV-1 is closely related to the virus that causes genital herpes (HSV-2), but these viruses are not the same. Once a person is infected with HSV-1, the virus remains permanently in the body. HSV-1 is spread from person to person through close contact, such as through kissing, touching the affected area, or sharing personal items such as lip balm, razors, or eating utensils. What increases the risk? A cold sore outbreak is more likely to develop in people who:  Are tired, stressed, or sick.  Are menstruating.  Are pregnant.  Take certain medicines.  Are exposed to cold weather or too much sun. What are the signs or symptoms? Symptoms of a cold sore outbreak often go through different stages. Here is how a cold sore develops:  Tingling, itching, or burning is felt 1-2 days before the outbreak.  Fluid-filled blisters appear on the lips, inside the mouth, on the nose, or on the cheeks.  The blisters start to ooze clear fluid.  The blisters dry up and a yellow crust appears in its place.  The crust falls off. Other symptoms include:  Fever.  Sore throat.  Headache.  Muscle aches.  Swollen neck glands. You also may not have any symptoms. How is this diagnosed? This condition is often diagnosed based on your medical history and a physical exam. Your health  care provider may swab your sore and then examine it in the lab. Rarely, blood tests may be done to check for HSV-1. How is this treated? There is no cure for cold sores or HSV-1. There also is no vaccine for HSV-1. Most cold sores go away on their own without treatment within two weeks. Medicines cannot make the infection go away, but medicines can:  Help relieve some of the pain associated with the sores.  Work to stop the virus from multiplying.  Shorten healing time. Medicines may be in the form of creams, gels, pills, or a shot. Follow these instructions at home: Medicines   Take or apply over-the-counter and prescription medicines only as told by your health care provider.  Use a cotton-tip swab to apply creams or gels to your sores. Sore Care   Do not touch the sores or pick the scabs.  Wash your hands often. Do not touch your eyes without washing your hands first.  Keep the sores clean and dry.  If directed, apply ice to the sores:  Put ice in a plastic bag.  Place a towel between your skin and the bag.  Leave the ice on for 20 minutes, 2-3 times per day. Lifestyle   Do not kiss, have oral sex, or share personal items until your sores heal.  Eat a soft, bland diet. Avoid eating hot, cold, or salty foods. These can hurt your mouth.  Use a straw if it hurts  to drink out of a glass.  Avoid the sun and limit your stress if these things trigger outbreaks. If sun causes cold sores, apply sunscreen on your lips before being out in the sun. Contact a health care provider if:  You have symptoms for more than two weeks.  You have pus coming from the sores.  You have redness that is spreading.  You have pain or irritation in your eye.  You get sores on your genitals.  Your sores do not heal within two weeks.  You have frequent cold sore outbreaks. Get help right away if:  You have a fever and your symptoms suddenly get worse.  You have a headache and  confusion. This information is not intended to replace advice given to you by your health care provider. Make sure you discuss any questions you have with your health care provider. Document Released: 02/25/2000 Document Revised: 10/22/2015 Document Reviewed: 12/18/2014 Elsevier Interactive Patient Education  2017 [employer].

## 2016-07-25 NOTE — Progress Notes (Signed)
Patient ID: Katelyn Wilcox, female   DOB: Jul 18, 1975, 41 y.o.   MRN: [id number]    Subjective:  I acted as a Neurosurgeon for Dr. Zola Button.  Apolonio Schneiders, CMA   Patient ID: Katelyn Wilcox, female    DOB: 1975/11/09, 41 y.o.   MRN: [id number]  Chief Complaint  Patient presents with  . Mouth Lesions    usually use valtrex but rx expired.    HPI  Patient is in today for cold sore on upper left lip.  She states that it started yesterday.  She usually take Valtrex at onset but prescription expired.  Patient Care Team: Zola Button, Grayling Congress, DO as PCP - General   Past Medical History:  Diagnosis Date  . Asthma   . Hypertension     Past Surgical History:  Procedure Laterality Date  . INTRAUTERINE DEVICE INSERTION  07/12/2016   Mirena  . NASAL SEPTUM SURGERY      Family History  Problem Relation Age of Onset  . Stroke Mother   . Hypertension Mother   . Diabetes Father   . Hypertension Father   . Aortic dissection Father   . Thyroid disease Unknown   . Thyroid cancer Unknown        Paternal cousin  . Aortic aneurysm Unknown   . Hashimoto's thyroiditis Sister   . Stroke Maternal Grandmother   . Stroke Maternal Grandfather   . Stroke Paternal Grandmother   . Dementia Paternal Grandfather   . Diabetes Paternal Aunt   . Stroke Maternal Uncle   . Ehlers-Danlos syndrome Son     Social History   Social History  . Marital status: Married    Spouse name: N/A  . Number of children: N/A  . Years of education: N/A   Occupational History  .      [employer] place-- wigs , prosthetics   Social History Main Topics  . Smoking status: Never Smoker  . Smokeless tobacco: Never Used  . Alcohol use Yes     Comment: rare  . Drug use: No  . Sexual activity: Yes    Partners: Male    Birth control/ protection: IUD     Comment: Mirena 07/12/2016   Other Topics Concern  . Not on file   Social History Narrative   Exercise--no    Outpatient Medications Prior to Visit  Medication Sig  Dispense Refill  . albuterol (VENTOLIN HFA) 108 (90 Base) MCG/ACT inhaler Inhale 2 puffs into the lungs every 6 (six) hours as needed for wheezing or shortness of breath. 18 g 0  . chlorthalidone (HYGROTON) 25 MG tablet Take 1 tablet (25 mg total) by mouth daily. 30 tablet 5  . fluticasone (FLONASE) 50 MCG/ACT nasal spray Place 2 sprays into both nostrils daily. 16 g 1  . SYMBICORT 80-4.5 MCG/ACT inhaler INHALE 2 PUFFS INTO THE LUNGS TWICE DAILY 10.2 g 0  . valACYclovir (VALTREX) 1000 MG tablet Take 1 tablet (1,000 mg total) by mouth 2 (two) times daily. 20 tablet 5   No facility-administered medications prior to visit.     Allergies  Allergen Reactions  . Penicillins     Review of Systems  Constitutional: Negative for fever and malaise/fatigue.  HENT: Negative for congestion.   Eyes: Negative for blurred vision.  Respiratory: Negative for cough and shortness of breath.   Cardiovascular: Negative for chest pain, palpitations and leg swelling.  Gastrointestinal: Negative for vomiting.  Musculoskeletal: Negative for back pain.  Skin: Negative for rash.  Cold sore upper left side of lip   Neurological: Negative for loss of consciousness and headaches.       Objective:    Physical Exam  Constitutional: She is oriented to person, place, and time. She appears well-developed and well-nourished. No distress.  HENT:  Head: Normocephalic and atraumatic.  Mouth/Throat: Oral lesions present.    Eyes: Conjunctivae are normal.  Neck: Normal range of motion. No thyromegaly present.  Cardiovascular: Normal rate and regular rhythm.   Pulmonary/Chest: Effort normal and breath sounds normal. She has no wheezes.  Abdominal: Soft. Bowel sounds are normal. There is no tenderness.  Musculoskeletal: Normal range of motion. She exhibits no edema or deformity.  Neurological: She is alert and oriented to person, place, and time.  Skin: Skin is warm and dry. She is not diaphoretic.    Psychiatric: She has a normal mood and affect.    BP 130/89 (BP Location: Left Arm, Cuff Size: Large)   Pulse 76   Temp 98.3 F (36.8 C) (Oral)   Resp 16   Ht 5\' 4"  (1.626 m)   Wt 212 lb 6.4 oz (96.3 kg)   LMP 07/08/2016   SpO2 99%   BMI 36.46 kg/m  Wt Readings from Last 3 Encounters:  07/25/16 212 lb 6.4 oz (96.3 kg)  02/21/16 214 lb (97.1 kg)  02/07/16 211 lb (95.7 kg)   BP Readings from Last 3 Encounters:  07/25/16 130/89  07/12/16 122/76  05/03/16 120/74     Immunization History  Administered Date(s) Administered  . H1N1 02/27/2008  . Influenza Whole 12/01/2009  . Influenza,inj,Quad PF,36+ Mos 11/25/2013  . Pneumococcal Polysaccharide-23 08/25/2013  . Td 11/25/2003  . Tdap 08/25/2013    Health Maintenance  Topic Date Due  . HIV Screening  10/24/2016 (Originally 03/11/1990)  . INFLUENZA VACCINE  10/11/2016  . PAP SMEAR  10/25/2018  . TETANUS/TDAP  08/26/2023    Lab Results  Component Value Date   WBC 7.7 10/25/2015   HGB 10.1 (L) 10/25/2015   HCT 32.1 (L) 10/25/2015   PLT 424.0 (H) 10/25/2015   GLUCOSE 93 10/25/2015   CHOL 185 10/25/2015   TRIG 130.0 10/25/2015   HDL 57.50 10/25/2015   LDLCALC 101 (H) 10/25/2015   ALT 11 10/25/2015   AST 15 10/25/2015   NA 135 10/25/2015   K 3.8 10/25/2015   CL 102 10/25/2015   CREATININE 0.77 10/25/2015   BUN 7 10/25/2015   CO2 26 10/25/2015   TSH 3.03 10/25/2015   MICROALBUR 1.2 08/25/2013    Lab Results  Component Value Date   TSH 3.03 10/25/2015   Lab Results  Component Value Date   WBC 7.7 10/25/2015   HGB 10.1 (L) 10/25/2015   HCT 32.1 (L) 10/25/2015   MCV 76.9 (L) 10/25/2015   PLT 424.0 (H) 10/25/2015   Lab Results  Component Value Date   NA 135 10/25/2015   K 3.8 10/25/2015   CO2 26 10/25/2015   GLUCOSE 93 10/25/2015   BUN 7 10/25/2015   CREATININE 0.77 10/25/2015   BILITOT 0.9 10/25/2015   ALKPHOS 85 10/25/2015   AST 15 10/25/2015   ALT 11 10/25/2015   PROT 7.1 10/25/2015    ALBUMIN 4.3 10/25/2015   CALCIUM 9.1 10/25/2015   GFR 87.97 10/25/2015   Lab Results  Component Value Date   CHOL 185 10/25/2015   Lab Results  Component Value Date   HDL 57.50 10/25/2015   Lab Results  Component Value Date   LDLCALC 101 (  H) 10/25/2015   Lab Results  Component Value Date   TRIG 130.0 10/25/2015   Lab Results  Component Value Date   CHOLHDL 3 10/25/2015   No results found for: HGBA1C       Assessment & Plan:   Problem List Items Addressed This Visit    None    Visit Diagnoses    Cold sore    -  Primary   Relevant Medications   valACYclovir (VALTREX) 1000 MG tablet      I have changed Ms. Picha's valACYclovir. I am also having her maintain her albuterol, chlorthalidone, fluticasone, and SYMBICORT.  Meds ordered this encounter  Medications  . valACYclovir (VALTREX) 1000 MG tablet    Sig: Take 1 tablet (1,000 mg total) by mouth 3 (three) times daily.    Dispense:  30 tablet    Refill:  5    CMA served as scribe during this visit. History, Physical and Plan performed by medical provider. Documentation and orders reviewed and attested to.  Donato Schultz, DO

## 2016-08-01 ENCOUNTER — Other Ambulatory Visit: Payer: Self-pay | Admitting: Family Medicine

## 2016-08-16 ENCOUNTER — Ambulatory Visit (INDEPENDENT_AMBULATORY_CARE_PROVIDER_SITE_OTHER): Payer: BLUE CROSS/BLUE SHIELD | Admitting: Gynecology

## 2016-08-16 ENCOUNTER — Encounter: Payer: Self-pay | Admitting: Gynecology

## 2016-08-16 VITALS — BP 114/70

## 2016-08-16 DIAGNOSIS — Z30431 Encounter for routine checking of intrauterine contraceptive device: Secondary | ICD-10-CM | POA: Diagnosis not present

## 2016-08-16 DIAGNOSIS — N926 Irregular menstruation, unspecified: Secondary | ICD-10-CM | POA: Diagnosis not present

## 2016-08-16 MED ORDER — MEGESTROL ACETATE 20 MG PO TABS: 20.0000 mg | ORAL_TABLET | Freq: Every day | ORAL | 0 refills | Status: DC

## 2016-08-16 NOTE — Patient Instructions (Signed)
Take megace medication for 14 days

## 2016-08-16 NOTE — Progress Notes (Signed)
    Katelyn Wilcox Mar 13, 1976 [id number]        41 y.o.  G3P0003 presents for IUD follow up exam. Has bled on and off since placement of [name] one month ago.  Past medical history,surgical history, problem list, medications, allergies, family history and social history were all reviewed and documented in the EPIC chart.  Directed ROS with pertinent positives and negatives documented in the history of present illness/assessment and plan.  Exam: Katelyn PortelaKim Wilcox assistant Vitals:   08/16/16 1031  BP: 114/70   General appearance:  Normal Abdomen soft nontender without masses guarding rebound Pelvic external BUS vagina with menses flow. Cervix normal. IUD string visualized. String was trimmed slightly. Uterus grossly normal size midline mobile nontender. Adnexa without masses or tenderness.  Assessment/Plan:  41 y.o. G3P0003 with continued irregular bleeding with recent [name] placement. Will treat with Megace 20 mg daily 14 days to hopefully thin the endometrium and abate her bleeding. Patient will follow up if bleeding continues. If she does well then she'll follow up 12 for annual exam.    Katelyn Wilcox,Katelyn Epperly P MD, 10:49 AM 08/16/2016

## 2016-09-08 ENCOUNTER — Other Ambulatory Visit: Payer: Self-pay | Admitting: Family Medicine

## 2016-09-27 ENCOUNTER — Ambulatory Visit (INDEPENDENT_AMBULATORY_CARE_PROVIDER_SITE_OTHER): Payer: BLUE CROSS/BLUE SHIELD

## 2016-09-27 ENCOUNTER — Ambulatory Visit (INDEPENDENT_AMBULATORY_CARE_PROVIDER_SITE_OTHER): Payer: BLUE CROSS/BLUE SHIELD | Admitting: Women's Health

## 2016-09-27 ENCOUNTER — Encounter: Payer: Self-pay | Admitting: Women's Health

## 2016-09-27 VITALS — BP 128/86

## 2016-09-27 DIAGNOSIS — N83202 Unspecified ovarian cyst, left side: Secondary | ICD-10-CM

## 2016-09-27 DIAGNOSIS — Z30011 Encounter for initial prescription of contraceptive pills: Secondary | ICD-10-CM | POA: Diagnosis not present

## 2016-09-27 DIAGNOSIS — N921 Excessive and frequent menstruation with irregular cycle: Secondary | ICD-10-CM | POA: Diagnosis not present

## 2016-09-27 MED ORDER — NORETHINDRONE 0.35 MG PO TABS: 1.0000 | ORAL_TABLET | Freq: Every day | ORAL | 2 refills | Status: DC

## 2016-09-27 NOTE — Patient Instructions (Signed)
Hysterectomy Information A hysterectomy is a surgery in which your uterus is removed. This surgery may be done to treat various medical problems. After the surgery, you will no longer have menstrual periods. The surgery will also make you unable to become pregnant (sterile). The fallopian tubes and ovaries can be removed (bilateral salpingo-oophorectomy) during this surgery as well. Reasons for a hysterectomy  Persistent, abnormal bleeding.  Lasting (chronic) pelvic pain or infection.  The lining of the uterus (endometrium) starts growing outside the uterus (endometriosis).  The endometrium starts growing in the muscle of the uterus (adenomyosis).  The uterus falls down into the vagina (pelvic organ prolapse).  Noncancerous growths in the uterus (uterine fibroids) that cause symptoms.  Precancerous cells.  Cervical cancer or uterine cancer. Types of hysterectomies  Supracervical hysterectomy-In this type, the top part of the uterus is removed, but not the cervix.  Total hysterectomy-The uterus and cervix are removed.  Radical hysterectomy-The uterus, the cervix, and the fibrous tissue that holds the uterus in place in the pelvis (parametrium) are removed. Ways a hysterectomy can be performed  Abdominal hysterectomy-A large surgical cut (incision) is made in the abdomen. The uterus is removed through this incision.  Vaginal hysterectomy-An incision is made in the vagina. The uterus is removed through this incision. There are no abdominal incisions.  Conventional laparoscopic hysterectomy-Three or four small incisions are made in the abdomen. A thin, lighted tube with a camera (laparoscope) is inserted into one of the incisions. Other tools are put through the other incisions. The uterus is cut into small pieces. The small pieces are removed through the incisions, or they are removed through the vagina.  Laparoscopically assisted vaginal hysterectomy (LAVH)-Three or four small  incisions are made in the abdomen. Part of the surgery is performed laparoscopically and part vaginally. The uterus is removed through the vagina.  Robot-assisted laparoscopic hysterectomy-A laparoscope and other tools are inserted into 3 or 4 small incisions in the abdomen. A computer-controlled device is used to give the surgeon a 3D image and to help control the surgical instruments. This allows for more precise movements of surgical instruments. The uterus is cut into small pieces and removed through the incisions or removed through the vagina. What are the risks? Possible complications associated with this procedure include:  Bleeding and risk of blood transfusion. Tell your health care provider if you do not want to receive any blood products.  Blood clots in the legs or lung.  Infection.  Injury to surrounding organs.  Problems or side effects related to anesthesia.  Conversion to an abdominal hysterectomy from one of the other techniques. What to expect after a hysterectomy  You will be given pain medicine.  You will need to have someone with you for the first 3-5 days after you go home.  You will need to follow up with your surgeon in 2-4 weeks after surgery to evaluate your progress.  You may have early menopause symptoms such as hot flashes, night sweats, and insomnia.  If you had a hysterectomy for a problem that was not cancer or not a condition that could lead to cancer, then you no longer need Pap tests. However, even if you no longer need a Pap test, a regular exam is a good idea to make sure no other problems are starting. This information is not intended to replace advice given to you by your health care provider. Make sure you discuss any questions you have with your health care provider. Document Released: 08/23/2000   Document Revised: 08/05/2015 Document Reviewed: 11/04/2012 Elsevier Interactive Patient Education  2017 Else[employer] Norethindrone tablets  (contraception) What is this medicine? NORETHINDRONE (nor eth IN drone) is an oral contraceptive. The product contains a female hormone known as a progestin. It is used to prevent pregnancy. This medicine may be used for other purposes; ask your health care provider or pharmacist if you have questions. COMMON BRAND NAME(S): Camila, Deblitane 28-Day, Errin, Heather, Jencycla, Jolivette, Lyza, Nor-QD, Nora-BE, Norlyroc, Ortho Micronor, Sharobel 28-Day What should I tell my health care provider before I take this medicine? They need to know if you have any of these conditions: -blood vessel disease or blood clots -breast, cervical, or vaginal cancer -diabetes -heart disease -kidney disease -liver disease -mental depression -migraine -seizures -stroke -vaginal bleeding -an unusual or allergic reaction to norethindrone, other medicines, foods, dyes, or preservatives -pregnant or trying to get pregnant -breast-feeding How should I use this medicine? Take this medicine by mouth with a glass of water. You may take it with or without food. Follow the directions on the prescription label. Take this medicine at the same time each day and in the order directed on the package. Do not take your medicine more often than directed. Contact your pediatrician regarding the use of this medicine in children. Special care may be needed. This medicine has been used in female children who have started having menstrual periods. A patient package insert for the product will be given with each prescription and refill. Read this sheet carefully each time. The sheet may change frequently. Overdosage: If you think you have taken too much of this medicine contact a poison control center or emergency room at once. NOTE: This medicine is only for you. Do not share this medicine with others. What if I miss a dose? Try not to miss a dose. Every time you miss a dose or take a dose late your chance of pregnancy increases.  When 1 pill is missed (even if only 3 hours late), take the missed pill as soon as possible and continue taking a pill each day at the regular time (use a back up method of birth control for the next 48 hours). If more than 1 dose is missed, use an additional birth control method for the rest of your pill pack until menses occurs. Contact your health care professional if more than 1 dose has been missed. What may interact with this medicine? Do not take this medicine with any of the following medications: -amprenavir or fosamprenavir -bosentan This medicine may also interact with the following medications: -antibiotics or medicines for infections, especially rifampin, rifabutin, rifapentine, and griseofulvin, and possibly penicillins or tetracyclines -aprepitant -barbiturate medicines, such as phenobarbital -carbamazepine -felbamate -modafinil -oxcarbazepine -phenytoin -ritonavir or other medicines for HIV infection or AIDS -St. John's wort -topiramate This list may not describe all possible interactions. Give your health care provider a list of all the medicines, herbs, non-prescription drugs, or dietary supplements you use. Also tell them if you smoke, drink alcohol, or use illegal drugs. Some items may interact with your medicine. What should I watch for while using this medicine? Visit your doctor or health care professional for regular checks on your progress. You will need a regular breast and pelvic exam and Pap smear while on this medicine. Use an additional method of birth control during the first cycle that you take these tablets. If you have any reason to think you are pregnant, stop taking this medicine right away and contact your doctor   or health care professional. If you are taking this medicine for hormone related problems, it may take several cycles of use to see improvement in your condition. This medicine does not protect you against HIV infection (AIDS) or any other sexually  transmitted diseases. What side effects may I notice from receiving this medicine? Side effects that you should report to your doctor or health care professional as soon as possible: -breast tenderness or discharge -pain in the abdomen, chest, groin or leg -severe headache -skin rash, itching, or hives -sudden shortness of breath -unusually weak or tired -vision or speech problems -yellowing of skin or eyes Side effects that usually do not require medical attention (report to your doctor or health care professional if they continue or are bothersome): -changes in sexual desire -change in menstrual flow -facial hair growth -fluid retention and swelling -headache -irritability -nausea -weight gain or loss This list may not describe all possible side effects. Call your doctor for medical advice about side effects. You may report side effects to FDA at 1-800-FDA-1088. Where should I keep my medicine? Keep out of the reach of children. Store at room temperature between 15 and 30 degrees C (59 and 86 degrees F). Throw away any unused medicine after the expiration date. NOTE: This sheet is a summary. It may not cover all possible information. If you have questions about this medicine, talk to your doctor, pharmacist, or health care provider.  2018 Elsevier/Gold Standard (2011-11-17 16:41:35)  

## 2016-09-27 NOTE — Progress Notes (Signed)
Presents with complaint of heavy irregular bleeding for the last 4 months. Reports extremely heavy bleeding with blood clots/gushing 3 days ago, bleeding through clothing, heavy overnight pad necessitating leaving from an activity. Had sharp vaginal pain previous day to heavy bleeding and  continued to bleed , bleeding much less today. [name] placed 07/2016 per Dr. Audie BoxFontaine for irregular heavy bleeding. Was treated with Megace 20 mg for 14 days June 6, continued to bleed with Megace. Denies urinary symptoms, vaginal discharge, fever. Has had some elevated blood pressures in the past.  Exam: Appears well, overweight, external genitalia within normal limits, speculum exam cervix without visible lesion or polyp, IUD strings not visible. Bimanual no CMT or adnexal tenderness. Ultrasound: Retroverted uterus. A single left uterine fibroids seen 14 x 11 mm. No IUD seen. Right ovary appears normal. 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 No free fluid seen. Transvaginal images.  DUB/menorrhagia IUD out at unknown time Left ovarian cyst  Plan: Options reviewed will try Micronor daily take at about the same time, reviewed not as effective as IUD, condoms first month, reviewed slight risk for blood clots and strokes. If continues to have heavy prolonged cycles, instructed to schedule appointment with Dr. Audie BoxFontaine to discuss possible hysterectomy.

## 2016-10-04 ENCOUNTER — Ambulatory Visit: Payer: BLUE CROSS/BLUE SHIELD | Admitting: Women's Health

## 2016-10-15 ENCOUNTER — Other Ambulatory Visit: Payer: Self-pay | Admitting: Family Medicine

## 2016-12-04 ENCOUNTER — Other Ambulatory Visit: Payer: Self-pay | Admitting: Family Medicine

## 2017-01-17 ENCOUNTER — Emergency Department (HOSPITAL_BASED_OUTPATIENT_CLINIC_OR_DEPARTMENT_OTHER)
Admission: EM | Admit: 2017-01-17 | Discharge: 2017-01-17 | Disposition: A | Discharge status: Home / Self Care | Payer: BLUE CROSS/BLUE SHIELD | Attending: Emergency Medicine | Admitting: Emergency Medicine

## 2017-01-17 ENCOUNTER — Encounter (HOSPITAL_BASED_OUTPATIENT_CLINIC_OR_DEPARTMENT_OTHER): Payer: Self-pay | Admitting: Respiratory Therapy

## 2017-01-17 DIAGNOSIS — B9789 Other viral agents as the cause of diseases classified elsewhere: Secondary | ICD-10-CM

## 2017-01-17 DIAGNOSIS — J4531 Mild persistent asthma with (acute) exacerbation: Secondary | ICD-10-CM | POA: Insufficient documentation

## 2017-01-17 DIAGNOSIS — B349 Viral infection, unspecified: Secondary | ICD-10-CM | POA: Diagnosis not present

## 2017-01-17 DIAGNOSIS — I10 Essential (primary) hypertension: Secondary | ICD-10-CM | POA: Insufficient documentation

## 2017-01-17 DIAGNOSIS — J4521 Mild intermittent asthma with (acute) exacerbation: Secondary | ICD-10-CM

## 2017-01-17 DIAGNOSIS — R062 Wheezing: Secondary | ICD-10-CM | POA: Diagnosis present

## 2017-01-17 DIAGNOSIS — J069 Acute upper respiratory infection, unspecified: Secondary | ICD-10-CM

## 2017-01-17 MED ORDER — METHYLPREDNISOLONE SODIUM SUCC 125 MG IJ SOLR
125.0000 mg | Freq: Once | INTRAMUSCULAR | Status: AC
Start: 2017-01-17 — End: 2017-01-17
  Administered 2017-01-17: 125 mg via INTRAVENOUS
  Filled 2017-01-17: qty 2

## 2017-01-17 MED ORDER — LORATADINE 10 MG PO TABS
10.0000 mg | ORAL_TABLET | Freq: Once | ORAL | Status: AC
  Administered 2017-01-17: 10 mg via ORAL
  Filled 2017-01-17: qty 1

## 2017-01-17 MED ORDER — FLUTICASONE PROPIONATE 50 MCG/ACT NA SUSP: 2.0000 | Freq: Every day | NASAL | 0 refills | Status: DC

## 2017-01-17 MED ORDER — PREDNISONE 20 MG PO TABS: ORAL_TABLET | ORAL | 0 refills | Status: DC

## 2017-01-17 MED ORDER — IPRATROPIUM BROMIDE 0.02 % IN SOLN
0.5000 mg | Freq: Once | RESPIRATORY_TRACT | Status: AC
  Administered 2017-01-17: 0.5 mg via RESPIRATORY_TRACT
  Filled 2017-01-17: qty 2.5

## 2017-01-17 MED ORDER — ALBUTEROL (5 MG/ML) CONTINUOUS INHALATION SOLN
10.0000 mg/h | INHALATION_SOLUTION | RESPIRATORY_TRACT | Status: AC
  Administered 2017-01-17: 10 mg/h via RESPIRATORY_TRACT

## 2017-01-17 MED ORDER — CETIRIZINE HCL 10 MG PO TABS: 10.0000 mg | ORAL_TABLET | Freq: Every day | ORAL | 0 refills | Status: DC

## 2017-01-17 MED ORDER — ALBUTEROL (5 MG/ML) CONTINUOUS INHALATION SOLN
INHALATION_SOLUTION | RESPIRATORY_TRACT | Status: AC
  Filled 2017-01-17: qty 2

## 2017-01-17 NOTE — ED Triage Notes (Signed)
Pt with nasal congestion sore throat and cough x 3 days progressively worsened throughout day

## 2017-01-17 NOTE — Progress Notes (Signed)
Patient states that she is breathing much better and is ready to go home.

## 2017-01-17 NOTE — ED Provider Notes (Signed)
[facility] EMERGENCY DEPARTMENT Provider Note   CSN: [id number] Arrival date & time: 01/17/17  0310     History   Chief Complaint Chief Complaint  Patient presents with  . Wheezing    HPI Katelyn Wilcox is a 41 y.o. female.  The history is provided by the patient.  Wheezing   This is a recurrent problem. The current episode started 12 to 24 hours ago. The problem occurs constantly. The problem has not changed since onset.Associated symptoms include cough. Pertinent negatives include no chest pain, no fever, no swollen glands, no hemoptysis and no sputum production. Associated symptoms comments: Nasal congestion . There was no precipitant for this problem. Treatments tried: rescue inhaler and tylenol cold and flu. The treatment provided no relief. She has had prior hospitalizations. She has had prior ED visits. She has had no prior ICU admissions. Her past medical history is significant for asthma.    Past Medical History:  Diagnosis Date  . Asthma   . Hypertension     Patient Active Problem List   Diagnosis Date Noted  . Acute bacterial sinusitis 01/21/2014  . Obesity (BMI 30-39.9) 08/25/2013  . HTN (hypertension) 08/21/2012  . Depression 12/01/2009  . URI 12/01/2009  . Asthma in adult 02/27/2008    Past Surgical History:  Procedure Laterality Date  . INTRAUTERINE DEVICE INSERTION  07/12/2016   Mirena  . NASAL SEPTUM SURGERY      OB History    Gravida Para Term Preterm AB Living   3       0 3   SAB TAB Ectopic Multiple Live Births       0           Home Medications    Prior to Admission medications   Medication Sig Start Date End Date Taking? Authorizing Provider  albuterol (VENTOLIN HFA) 108 (90 Base) MCG/ACT inhaler Inhale 2 puffs into the lungs every 6 (six) hours as needed for wheezing or shortness of breath. 08/30/15   Donato SchultzLowne Chase, Yvonne R, DO  chlorthalidone (HYGROTON) 25 MG tablet Take 1 tablet (25 mg total) by mouth daily. 10/25/15    Seabron SpatesLowne Chase, Yvonne R, DO  fluticasone (FLONASE) 50 MCG/ACT nasal spray Place 2 sprays into both nostrils daily. 02/07/16   Saguier, Ramon DredgeEdward, PA-C  megestrol (MEGACE) 20 MG tablet Take 1 tablet (20 mg total) by mouth daily. Patient not taking: Reported on 09/27/2016 08/16/16   Fontaine, Nadyne Coombesimothy P, MD  norethindrone (ORTHO MICRONOR) 0.35 MG tablet Take 1 tablet (0.35 mg total) by mouth daily. 09/27/16   Harrington ChallengerYoung, Nancy J, NP  SYMBICORT 80-4.5 MCG/ACT inhaler INHALE 2 PUFFS INTO THE LUNGS TWICE DAILY 04/25/16   Zola ButtonLowne Chase, Grayling CongressYvonne R, DO  SYMBICORT 80-4.5 MCG/ACT inhaler INHALE 2 PUFFS INTO THE LUNGS TWICE DAILY 12/05/16   Zola ButtonLowne Chase, Grayling CongressYvonne R, DO  valACYclovir (VALTREX) 1000 MG tablet Take 1 tablet (1,000 mg total) by mouth 3 (three) times daily. 07/25/16   Donato SchultzLowne Chase, Yvonne R, DO    Family History Family History  Problem Relation Age of Onset  . Stroke Mother   . Hypertension Mother   . Diabetes Father   . Hypertension Father   . Aortic dissection Father   . Thyroid disease Unknown   . Thyroid cancer Unknown        Paternal cousin  . Aortic aneurysm Unknown   . Hashimoto's thyroiditis Sister   . Stroke Maternal Grandmother   . Stroke Maternal Grandfather   . Stroke Paternal Grandmother   .  Dementia Paternal Grandfather   . Diabetes Paternal Aunt   . Stroke Maternal Uncle   . Ehlers-Danlos syndrome Son     Social History Social History   Tobacco Use  . Smoking status: Never Smoker  . Smokeless tobacco: Never Used  Substance Use Topics  . Alcohol use: Yes    Comment: rare  . Drug use: No     Allergies   Penicillins   Review of Systems Review of Systems  Constitutional: Negative for diaphoresis and fever.  HENT: Positive for congestion.   Respiratory: Positive for cough and wheezing. Negative for hemoptysis, sputum production and shortness of breath.   Cardiovascular: Negative for chest pain, palpitations and leg swelling.  All other systems reviewed and are  negative.    Physical Exam Updated Vital Signs BP (!) 145/105 (BP Location: Left Arm)   Pulse 88   Temp 98.8 F (37.1 C) (Oral)   Resp (!) 24   Ht 5\' 4"  (1.626 m)   Wt 97.5 kg (215 lb)   LMP 12/20/2016   SpO2 92%   BMI 36.90 kg/m   Physical Exam  Constitutional: She is oriented to person, place, and time. She appears well-developed and well-nourished. No distress.  HENT:  Head: Normocephalic and atraumatic.  Mouth/Throat: No oropharyngeal exudate.  Eyes: Conjunctivae are normal. Pupils are equal, round, and reactive to light.  Neck: Normal range of motion. Neck supple. No JVD present. No tracheal deviation present.  Cardiovascular: Normal rate, regular rhythm, normal heart sounds and intact distal pulses.  Pulmonary/Chest: No stridor. No respiratory distress. She has wheezes.  Abdominal: Soft. Bowel sounds are normal. She exhibits no mass. There is no tenderness. There is no rebound and no guarding.  Musculoskeletal: Normal range of motion. She exhibits no edema or tenderness.  Neurological: She is alert and oriented to person, place, and time.  Skin: Skin is warm and dry. Capillary refill takes less than 2 seconds.  Psychiatric: She has a normal mood and affect.     ED Treatments / Results  Labs (all labs ordered are listed, but only abnormal results are displayed) Labs Reviewed - No data to display  EKG  EKG Interpretation None       Radiology No results found.  Procedures Procedures (including critical care time)  Medications Ordered in ED Medications  methylPREDNISolone sodium succinate (SOLU-MEDROL) 125 mg/2 mL injection 125 mg (not administered)  loratadine (CLARITIN) tablet 10 mg (not administered)  albuterol (PROVENTIL,VENTOLIN) solution continuous neb (not administered)  ipratropium (ATROVENT) nebulizer solution 0.5 mg (not administered)  albuterol (PROVENTIL, VENTOLIN) (5 MG/ML) 0.5% continuous inhalation solution (not administered)      Final  Clinical Impressions(s) / ED Diagnoses   Strict return precautions for swelling or the lips or tongue, chest pain, dyspnea on exertion, new weakness or numbness changes in vision or speech,  Inability to tolerate liquids or food, changes in voice cough, altered mental status or any concerns. No signs of systemic illness or infection. The patient is nontoxic-appearing on exam and vital signs are within normal limits.    I have reviewed the triage vital signs and the nursing notes. Pertinent labs &imaging results that were available during my care of the patient were reviewed by me and considered in my medical decision making (see chart for details).  After history, exam, and medical workup I feel the patient has been appropriately medically screened and is safe for discharge home. Pertinent diagnoses were discussed with the patient. Patient was given return precautions.  Jannae Fagerstrom, MD 01/17/17 [phone]

## 2017-02-24 ENCOUNTER — Other Ambulatory Visit: Payer: Self-pay | Admitting: Family Medicine

## 2017-02-24 DIAGNOSIS — J4520 Mild intermittent asthma, uncomplicated: Secondary | ICD-10-CM

## 2017-02-26 MED ORDER — BUDESONIDE-FORMOTEROL FUMARATE 80-4.5 MCG/ACT IN AERO: 2.0000 | INHALATION_SPRAY | Freq: Two times a day (BID) | RESPIRATORY_TRACT | 1 refills | Status: DC

## 2017-04-19 ENCOUNTER — Ambulatory Visit (INDEPENDENT_AMBULATORY_CARE_PROVIDER_SITE_OTHER): Payer: BLUE CROSS/BLUE SHIELD | Admitting: Family Medicine

## 2017-04-19 ENCOUNTER — Encounter: Payer: Self-pay | Admitting: Family Medicine

## 2017-04-19 VITALS — BP 108/78 | HR 90 | Temp 99.4°F | Resp 16 | Ht 64.0 in | Wt 206.4 lb

## 2017-04-19 DIAGNOSIS — Z0000 Encounter for general adult medical examination without abnormal findings: Secondary | ICD-10-CM | POA: Diagnosis not present

## 2017-04-19 DIAGNOSIS — Z23 Encounter for immunization: Secondary | ICD-10-CM

## 2017-04-19 DIAGNOSIS — J4540 Moderate persistent asthma, uncomplicated: Secondary | ICD-10-CM | POA: Diagnosis not present

## 2017-04-19 MED ORDER — MONTELUKAST SODIUM 10 MG PO TABS: 10.0000 mg | ORAL_TABLET | Freq: Every day | ORAL | 11 refills | Status: DC

## 2017-04-19 MED ORDER — BUDESONIDE-FORMOTEROL FUMARATE 160-4.5 MCG/ACT IN AERO: 2.0000 | INHALATION_SPRAY | Freq: Two times a day (BID) | RESPIRATORY_TRACT | 3 refills | Status: DC

## 2017-04-19 MED ORDER — ALBUTEROL SULFATE HFA 108 (90 BASE) MCG/ACT IN AERS: 2.0000 | INHALATION_SPRAY | Freq: Four times a day (QID) | RESPIRATORY_TRACT | 3 refills | Status: AC | PRN

## 2017-04-19 NOTE — Progress Notes (Signed)
Subjective:     Katelyn Wilcox is a 42 y.o. female and is here for a comprehensive physical exam. The patient reports problems - asthma problems --- has been to the ER .  Social History   Socioeconomic History  . Marital status: Married    Spouse name: Not on file  . Number of children: Not on file  . Years of education: Not on file  . Highest education level: Not on file  Social Needs  . Financial resource strain: Not on file  . Food insecurity - worry: Not on file  . Food insecurity - inability: Not on file  . Transportation needs - medical: Not on file  . Transportation needs - non-medical: Not on file  Occupational History    Comment: special place-- wigs , prosthetics  Tobacco Use  . Smoking status: Never Smoker  . Smokeless tobacco: Never Used  Substance and Sexual Activity  . Alcohol use: Yes    Comment: rare  . Drug use: No  . Sexual activity: Yes    Partners: Male    Birth control/protection: IUD    Comment: Mirena 07/12/2016  Other Topics Concern  . Not on file  Social History Narrative   Exercise--no   Health Maintenance  Topic Date Due  . INFLUENZA VACCINE  10/11/2016  . MAMMOGRAM  02/13/2017  . HIV Screening  04/20/2023 (Originally 03/11/1991)  . PAP SMEAR  10/25/2018  . TETANUS/TDAP  08/26/2023    The following portions of the patient's history were reviewed and updated as appropriate:  She  has a past medical history of Asthma and Hypertension. She does not have any pertinent problems on file. She  has a past surgical history that includes Nasal septum surgery and Intrauterine device insertion (07/12/2016). Her family history includes Aortic aneurysm in her unknown relative; Aortic dissection in her father; Dementia in her paternal grandfather; Diabetes in her father and paternal aunt; Ehlers-Danlos syndrome in her son; Hashimoto's thyroiditis in her sister; Hypertension in her father and mother; Stroke in her maternal grandfather, maternal grandmother,  maternal uncle, mother, and paternal grandmother; Thyroid cancer in her unknown relative; Thyroid disease in her unknown relative. She  reports that  has never smoked. she has never used smokeless tobacco. She reports that she drinks alcohol. She reports that she does not use drugs. She has a current medication list which includes the following prescription(s): albuterol, cetirizine, chlorthalidone, fluticasone, norethindrone, valacyclovir, budesonide-formoterol, and montelukast. Current Outpatient Medications on File Prior to Visit  Medication Sig Dispense Refill  . cetirizine (ZYRTEC ALLERGY) 10 MG tablet Take 1 tablet (10 mg total) daily by mouth. 30 tablet 0  . chlorthalidone (HYGROTON) 25 MG tablet Take 1 tablet (25 mg total) by mouth daily. 30 tablet 5  . fluticasone (FLONASE) 50 MCG/ACT nasal spray Place 2 sprays daily into both nostrils. 16 g 0  . norethindrone (ORTHO MICRONOR) 0.35 MG tablet Take 1 tablet (0.35 mg total) by mouth daily. 3 Package 2  . valACYclovir (VALTREX) 1000 MG tablet Take 1 tablet (1,000 mg total) by mouth 3 (three) times daily. 30 tablet 5   No current facility-administered medications on file prior to visit.    She is allergic to penicillins..  Review of Systems Review of Systems  Constitutional: Negative for activity change, appetite change and fatigue.  HENT: Negative for hearing loss, congestion, tinnitus and ear discharge.  dentist [id number] Eyes: Negative for visual disturbance (see optho q1y -- vision corrected to 20/20 with glasses).  Respiratory: Negative for  cough, chest tightness and shortness of breath.   Cardiovascular: Negative for chest pain, palpitations and leg swelling.  Gastrointestinal: Negative for abdominal pain, diarrhea, constipation and abdominal distention.  Genitourinary: Negative for urgency, frequency, decreased urine volume and difficulty urinating.  Musculoskeletal: Negative for back pain, arthralgias and gait problem.  Skin: Negative  for color change, pallor and rash.  Neurological: Negative for dizziness, light-headedness, numbness and headaches.  Hematological: Negative for adenopathy. Does not bruise/bleed easily.  Psychiatric/Behavioral: Negative for suicidal ideas, confusion, sleep disturbance, self-injury, dysphoric mood, decreased concentration and agitation.       Objective:    BP 108/78 (BP Location: Left Arm, Cuff Size: Large)   Pulse 90   Temp 99.4 F (37.4 C) (Oral)   Resp 16   Ht 5\' 4"  (1.626 m)   Wt 206 lb 6.4 oz (93.6 kg)   LMP 04/14/2017   SpO2 90%   BMI 35.43 kg/m  General appearance: alert, cooperative, appears stated age and no distress Head: Normocephalic, without obvious abnormality, atraumatic Eyes: negative findings: lids and lashes normal and conjunctivae and sclerae normal Ears: normal TM's and external ear canals both ears Nose: Nares normal. Septum midline. Mucosa normal. No drainage or sinus tenderness. Throat: lips, mucosa, and tongue normal; teeth and gums normal Neck: no adenopathy, no carotid bruit, no JVD, supple, symmetrical, trachea midline and thyroid not enlarged, symmetric, no tenderness/mass/nodules Back: symmetric, no curvature. ROM normal. No CVA tenderness. Lungs: clear to auscultation bilaterally Breasts: gyn Heart: regular rate and rhythm, S1, S2 normal, no murmur, click, rub or gallop Abdomen: soft, non-tender; bowel sounds normal; no masses,  no organomegaly Pelvic: deferred ---gyn Extremities: extremities normal, atraumatic, no cyanosis or edema Pulses: 2+ and symmetric Skin: Skin color, texture, turgor normal. No rashes or lesions Lymph nodes: Cervical, supraclavicular, and axillary nodes normal. Neurologic: Alert and oriented X 3, normal strength and tone. Normal symmetric reflexes. Normal coordination and gait    Assessment:    Healthy female exam.      Plan:    ghm utd Check labs  See After Visit Summary for Counseling Recommendations    1.  Preventative health care See above - CBC with Differential/Platelet - Comprehensive metabolic panel - Lipid panel - TSH  2. Moderate persistent asthma, unspecified whether complicated  - budesonide-formoterol (SYMBICORT) 160-4.5 MCG/ACT inhaler; Inhale 2 puffs into the lungs 2 (two) times daily.  Dispense: 1 Inhaler; Refill: 3 - montelukast (SINGULAIR) 10 MG tablet; Take 1 tablet (10 mg total) by mouth at bedtime.  Dispense: 30 tablet; Refill: 11 - albuterol (VENTOLIN HFA) 108 (90 Base) MCG/ACT inhaler; Inhale 2 puffs into the lungs every 6 (six) hours as needed for wheezing or shortness of breath.  Dispense: 18 g; Refill: 3

## 2017-04-19 NOTE — Patient Instructions (Signed)
Preventive Care 40-64 Years, Female Preventive care refers to lifestyle choices and visits with your health care provider that can promote health and wellness. What does preventive care include?  A yearly physical exam. This is also called an annual well check.  Dental exams once or twice a year.  Routine eye exams. Ask your health care provider how often you should have your eyes checked.  Personal lifestyle choices, including: ? Daily care of your teeth and gums. ? Regular physical activity. ? Eating a healthy diet. ? Avoiding tobacco and drug use. ? Limiting alcohol use. ? Practicing safe sex. ? Taking low-dose aspirin daily starting at age 58. ? Taking vitamin and mineral supplements as recommended by your health care provider. What happens during an annual well check? The services and screenings done by your health care provider during your annual well check will depend on your age, overall health, lifestyle risk factors, and family history of disease. Counseling Your health care provider may ask you questions about your:  Alcohol use.  Tobacco use.  Drug use.  Emotional well-being.  Home and relationship well-being.  Sexual activity.  Eating habits.  Work and work Statistician.  Method of birth control.  Menstrual cycle.  Pregnancy history.  Screening You may have the following tests or measurements:  Height, weight, and BMI.  Blood pressure.  Lipid and cholesterol levels. These may be checked every 5 years, or more frequently if you are over 81 years old.  Skin check.  Lung cancer screening. You may have this screening every year starting at age 78 if you have a 30-pack-year history of smoking and currently smoke or have quit within the past 15 years.  Fecal occult blood test (FOBT) of the stool. You may have this test every year starting at age 65.  Flexible sigmoidoscopy or colonoscopy. You may have a sigmoidoscopy every 5 years or a colonoscopy  every 10 years starting at age 30.  Hepatitis C blood test.  Hepatitis B blood test.  Sexually transmitted disease (STD) testing.  Diabetes screening. This is done by checking your blood sugar (glucose) after you have not eaten for a while (fasting). You may have this done every 1-3 years.  Mammogram. This may be done every 1-2 years. Talk to your health care provider about when you should start having regular mammograms. This may depend on whether you have a family history of breast cancer.  BRCA-related cancer screening. This may be done if you have a family history of breast, ovarian, tubal, or peritoneal cancers.  Pelvic exam and Pap test. This may be done every 3 years starting at age 80. Starting at age 36, this may be done every 5 years if you have a Pap test in combination with an HPV test.  Bone density scan. This is done to screen for osteoporosis. You may have this scan if you are at high risk for osteoporosis.  Discuss your test results, treatment options, and if necessary, the need for more tests with your health care provider. Vaccines Your health care provider may recommend certain vaccines, such as:  Influenza vaccine. This is recommended every year.  Tetanus, diphtheria, and acellular pertussis (Tdap, Td) vaccine. You may need a Td booster every 10 years.  Varicella vaccine. You may need this if you have not been vaccinated.  Zoster vaccine. You may need this after age 5.  Measles, mumps, and rubella (MMR) vaccine. You may need at least one dose of MMR if you were born in  1957 or later. You may also need a second dose.  Pneumococcal 13-valent conjugate (PCV13) vaccine. You may need this if you have certain conditions and were not previously vaccinated.  Pneumococcal polysaccharide (PPSV23) vaccine. You may need one or two doses if you smoke cigarettes or if you have certain conditions.  Meningococcal vaccine. You may need this if you have certain  conditions.  Hepatitis A vaccine. You may need this if you have certain conditions or if you travel or work in places where you may be exposed to hepatitis A.  Hepatitis B vaccine. You may need this if you have certain conditions or if you travel or work in places where you may be exposed to hepatitis B.  Haemophilus influenzae type b (Hib) vaccine. You may need this if you have certain conditions.  Talk to your health care provider about which screenings and vaccines you need and how often you need them. This information is not intended to replace advice given to you by your health care provider. Make sure you discuss any questions you have with your health care provider. Document Released: 03/26/2015 Document Revised: 11/17/2015 Document Reviewed: 12/29/2014 Elsevier Interactive Patient Education  2018 [employer]vier Inc[employer]  

## 2017-05-05 ENCOUNTER — Other Ambulatory Visit: Payer: Self-pay | Admitting: Family Medicine

## 2017-05-05 DIAGNOSIS — J4520 Mild intermittent asthma, uncomplicated: Secondary | ICD-10-CM

## 2017-06-01 ENCOUNTER — Other Ambulatory Visit: Payer: Self-pay | Admitting: Women's Health

## 2017-06-01 DIAGNOSIS — N921 Excessive and frequent menstruation with irregular cycle: Secondary | ICD-10-CM

## 2017-06-22 ENCOUNTER — Encounter: Payer: Self-pay | Admitting: Medical

## 2017-06-22 ENCOUNTER — Ambulatory Visit: Payer: BLUE CROSS/BLUE SHIELD | Admitting: Medical

## 2017-06-22 VITALS — BP 134/85 | HR 79 | Temp 98.7°F | Resp 16 | Ht 64.0 in | Wt 200.6 lb

## 2017-06-22 DIAGNOSIS — J301 Allergic rhinitis due to pollen: Secondary | ICD-10-CM

## 2017-06-22 DIAGNOSIS — J029 Acute pharyngitis, unspecified: Secondary | ICD-10-CM | POA: Diagnosis not present

## 2017-06-22 LAB — POCT RAPID STREP A (OFFICE): Rapid Strep A Screen: NEGATIVE

## 2017-06-22 MED ORDER — FLUTICASONE PROPIONATE 50 MCG/ACT NA SUSP: 2.0000 | Freq: Every day | NASAL | 0 refills | Status: DC

## 2017-06-22 MED ORDER — AZITHROMYCIN 250 MG PO TABS: ORAL_TABLET | ORAL | 0 refills | Status: DC

## 2017-06-22 MED ORDER — CETIRIZINE HCL 10 MG PO TABS
10.0000 mg | ORAL_TABLET | Freq: Every day | ORAL | 0 refills | Status: DC
Start: 2017-06-22 — End: 2017-12-07

## 2017-06-22 NOTE — Progress Notes (Signed)
Subjective:    Patient ID: Katelyn Wilcox, female    DOB: 03-Jan-1976, 42 y.o.   MRN: [id number]  HPI  Pt in states daughter has strep throat. Wed diagnosed with strep. Both had nasal congestion and runny nose. Daughter test was positive for strep.  Pt does have some runny nose and pnd. Some rt ear pressure. Pt has hx of allergies. Pt uses flonase but ran out. She also needs zyrtec.  About to go out of town on Sunday night.  Review of Systems  Constitutional: Negative for appetite change, chills and fatigue.  HENT: Positive for congestion, postnasal drip and sore throat.        Severe sore throat since last night.  Respiratory: Negative for cough, choking, chest tightness, shortness of breath and wheezing.   Cardiovascular: Negative for chest pain and palpitations.  Gastrointestinal: Negative for abdominal pain.  Genitourinary: Negative for frequency.  Musculoskeletal: Negative for back pain and myalgias.  Skin: Negative for rash.  Neurological: Negative for dizziness and headaches.  Hematological: Positive for adenopathy.  Psychiatric/Behavioral: Negative for behavioral problems.     Past Medical History:  Diagnosis Date  . Asthma   . Hypertension      Social History   Socioeconomic History  . Marital status: Married    Spouse name: Not on file  . Number of children: Not on file  . Years of education: Not on file  . Highest education level: Not on file  Occupational History    Comment: special place-- wigs , prosthetics  Social Needs  . Financial resource strain: Not on file  . Food insecurity:    Worry: Not on file    Inability: Not on file  . Transportation needs:    Medical: Not on file    Non-medical: Not on file  Tobacco Use  . Smoking status: Never Smoker  . Smokeless tobacco: Never Used  Substance and Sexual Activity  . Alcohol use: Yes    Comment: rare  . Drug use: No  . Sexual activity: Yes    Partners: Male    Birth control/protection: IUD   Comment: Mirena 07/12/2016  Lifestyle  . Physical activity:    Days per week: Not on file    Minutes per session: Not on file  . Stress: Not on file  Relationships  . Social connections:    Talks on phone: Not on file    Gets together: Not on file    Attends religious service: Not on file    Active member of club or organization: Not on file    Attends meetings of clubs or organizations: Not on file    Relationship status: Not on file  . Intimate partner violence:    Fear of current or ex partner: Not on file    Emotionally abused: Not on file    Physically abused: Not on file    Forced sexual activity: Not on file  Other Topics Concern  . Not on file  Social History Narrative   Exercise--no    Past Surgical History:  Procedure Laterality Date  . INTRAUTERINE DEVICE INSERTION  07/12/2016   Mirena  . NASAL SEPTUM SURGERY      Family History  Problem Relation Age of Onset  . Stroke Mother   . Hypertension Mother   . Diabetes Father   . Hypertension Father   . Aortic dissection Father   . Thyroid disease Unknown   . Thyroid cancer Unknown  Paternal cousin  . Aortic aneurysm Unknown   . Hashimoto's thyroiditis Sister   . Stroke Maternal Grandmother   . Stroke Maternal Grandfather   . Stroke Paternal Grandmother   . Dementia Paternal Grandfather   . Diabetes Paternal Aunt   . Stroke Maternal Uncle   . Ehlers-Danlos syndrome Son     Allergies  Allergen Reactions  . Penicillins     Current Outpatient Medications on File Prior to Visit  Medication Sig Dispense Refill  . albuterol (VENTOLIN HFA) 108 (90 Base) MCG/ACT inhaler Inhale 2 puffs into the lungs every 6 (six) hours as needed for wheezing or shortness of breath. 18 g 3  . chlorthalidone (HYGROTON) 25 MG tablet Take 1 tablet (25 mg total) by mouth daily. 30 tablet 5  . montelukast (SINGULAIR) 10 MG tablet Take 1 tablet (10 mg total) by mouth at bedtime. 30 tablet 11  . norethindrone (ORTHO MICRONOR)  0.35 MG tablet Take 1 tablet (0.35 mg total) by mouth daily. 3 Package 2  . SYMBICORT 80-4.5 MCG/ACT inhaler INHALE 2 PUFFS INTO THE LUNGS TWICE DAILY 10.2 g 2  . valACYclovir (VALTREX) 1000 MG tablet Take 1 tablet (1,000 mg total) by mouth 3 (three) times daily. 30 tablet 5   No current facility-administered medications on file prior to visit.     BP 134/85   Pulse 79   Temp 98.7 F (37.1 C) (Oral)   Resp 16   Ht  (1.626 m)   Wt 200 lb 9.6 oz (91 kg)   SpO2 98%   PF 79 L/min   BMI 34.43 kg/m       Objective:   Physical Exam  General  Mental Status - Alert. General Appearance - Well groomed. Not in acute distress.  Skin Rashes- No Rashes.  HEENT Head- Normal. Ear Auditory Canal - Left- Normal. Right - Normal.Tympanic Membrane- Left- Normal. Right- Normal. Eye Sclera/Conjunctiva- Left- Normal. Right- Normal. Nose & Sinuses Nasal Mucosa- Left-  Boggy and Congested. Right-  Boggy and  Congested.Bilateral maxillary and frontal sinus pressure. Mouth & Throat Lips: Upper Lip- Normal: no dryness, cracking, pallor, cyanosis, or vesicular eruption. Lower Lip-Normal: no dryness, cracking, pallor, cyanosis or vesicular eruption. Buccal Mucosa- Bilateral- No Aphthous ulcers. Oropharynx- No Discharge or Erythema. Tonsils: Characteristics- Bilateral- No Erythema or Congestion. Size/Enlargement- Bilateral- No enlargement. Discharge- bilateral-None.  Neck Neck- Supple. No Masses.   Chest and Lung Exam Auscultation: Breath Sounds:-Clear even and unlabored.  Cardiovascular Auscultation:Rythm- Regular, rate and rhythm. Murmurs & Other Heart Sounds:Ausculatation of the heart reveal- No Murmurs.  Lymphatic Head & Neck General Head & Neck Lymphatics: Bilateral: Description- No Localized lymphadenopathy.        Assessment & Plan:  Your strep test was negative. However, your physical exam, exposure to strep through daughter   and clinical presentation is suspicious for  strep .It is important to note that rapid strep test can be falsely negative. So I am going to give you azithromycin antibiotic today based on your exam and clinical presentation.  For recent allegic rhinitis refilled flonase and zyrtec.  Rest hydrate, tylenol for fever, and warm salt water gargles.   Follow up in 7 days or as needed.   Esperanza Richters, PA-C

## 2017-06-22 NOTE — Patient Instructions (Addendum)
Your strep test was negative. However, your physical exam, exposure to strep through daughter   and clinical presentation is suspicious for strep .It is important to note that rapid strep test can be falsely negative. So I am going to give you azithromycin  antibiotic today based on your exam and clinical presentation.  For recent allegic rhinitis refilled flonase and zyrtec.  Rest hydrate, tylenol for fever, and warm salt water gargles.   Follow up in 7 days or as needed.

## 2017-07-06 ENCOUNTER — Other Ambulatory Visit: Payer: Self-pay | Admitting: Gynecology

## 2017-07-06 DIAGNOSIS — N921 Excessive and frequent menstruation with irregular cycle: Secondary | ICD-10-CM

## 2017-12-07 ENCOUNTER — Other Ambulatory Visit: Payer: Self-pay | Admitting: Medical

## 2018-01-17 ENCOUNTER — Encounter: Payer: Self-pay | Admitting: Family Medicine

## 2018-01-17 DIAGNOSIS — I10 Essential (primary) hypertension: Secondary | ICD-10-CM

## 2018-01-17 MED ORDER — CHLORTHALIDONE 25 MG PO TABS: 25.0000 mg | ORAL_TABLET | Freq: Every day | ORAL | 1 refills | Status: DC

## 2018-01-17 NOTE — Telephone Encounter (Signed)
Ok to refill x1 but make ov as well

## 2018-01-17 NOTE — Addendum Note (Signed)
Addended byConrad Lockhart D on: 01/17/2018 02:30 PM   Modules accepted: Orders

## 2018-01-31 ENCOUNTER — Other Ambulatory Visit: Payer: Self-pay | Admitting: Family Medicine

## 2018-01-31 DIAGNOSIS — J4520 Mild intermittent asthma, uncomplicated: Secondary | ICD-10-CM

## 2018-03-27 ENCOUNTER — Other Ambulatory Visit: Payer: Self-pay | Admitting: Family Medicine

## 2018-03-27 DIAGNOSIS — I10 Essential (primary) hypertension: Secondary | ICD-10-CM

## 2018-06-13 ENCOUNTER — Other Ambulatory Visit: Payer: Self-pay | Admitting: Medical

## 2018-09-11 ENCOUNTER — Other Ambulatory Visit: Payer: Self-pay | Admitting: Family Medicine

## 2018-11-09 ENCOUNTER — Other Ambulatory Visit: Payer: Self-pay | Admitting: Family Medicine

## 2018-12-03 ENCOUNTER — Encounter: Payer: Self-pay | Admitting: Gynecology

## 2019-10-11 LAB — HM PAP SMEAR: HM Pap smear: NORMAL

## 2019-10-11 LAB — HM MAMMOGRAPHY

## 2020-06-30 ENCOUNTER — Encounter: Payer: Self-pay | Admitting: Family Medicine

## 2020-06-30 ENCOUNTER — Other Ambulatory Visit: Payer: Self-pay

## 2020-06-30 ENCOUNTER — Telehealth (INDEPENDENT_AMBULATORY_CARE_PROVIDER_SITE_OTHER): Payer: 59 | Admitting: Family Medicine

## 2020-06-30 ENCOUNTER — Telehealth: Payer: Self-pay

## 2020-06-30 DIAGNOSIS — I10 Essential (primary) hypertension: Secondary | ICD-10-CM | POA: Diagnosis not present

## 2020-06-30 DIAGNOSIS — L282 Other prurigo: Secondary | ICD-10-CM

## 2020-06-30 DIAGNOSIS — H6981 Other specified disorders of Eustachian tube, right ear: Secondary | ICD-10-CM

## 2020-06-30 MED ORDER — SERTRALINE HCL 50 MG PO TABS: 50.0000 mg | ORAL_TABLET | Freq: Every day | ORAL | 3 refills | Status: DC

## 2020-06-30 MED ORDER — CHLORTHALIDONE 25 MG PO TABS: ORAL_TABLET | ORAL | 1 refills | Status: DC

## 2020-06-30 MED ORDER — TRIAMCINOLONE ACETONIDE 0.1 % EX CREA: 1.0000 "application " | TOPICAL_CREAM | Freq: Two times a day (BID) | CUTANEOUS | 0 refills | Status: DC

## 2020-06-30 NOTE — Progress Notes (Signed)
Chief Complaint  Patient presents with  . Ear Pain    Katelyn Wilcox is a 45 y.o. female here for a skin complaint. Due to COVID-19 pandemic, we are interacting via web portal for an electronic face-to-face visit. I verified patient's ID using 2 identifiers. Patient agreed to proceed with visit via this method. Patient is at home, I am at office. Patient and I are present for visit.   Duration: 2 days Location: wrists and hands Pruritic? Yes Painful? No Drainage? No New soaps/lotions/topicals/detergents? No Sick contacts? No Other associated symptoms: no fevers Therapies tried thus far: ice, cortisone  R ear pain off and on for a mo. Has not tried anything. Feels like fluid inside. No trauma, fevers, URI s/s's.   Past Medical History:  Diagnosis Date  . Asthma   . Hypertension    Exam No conversational dyspnea Age appropriate judgment and insight Nml affect and mood  Pruritic rash - Plan: triamcinolone cream (KENALOG) 0.1 %  Essential hypertension - Plan: chlorthalidone (HYGROTON) 25 MG tablet  Bid Kenalog. Avoid scented products. Try not to itch. Ice. PO antihist.  Refill Zoloft and chlorthalidone until she can get in w new pcp.  [facility] for ETD on R.  F/u prn. The patient voiced understanding and agreement to the plan.  Jilda Roche Luis Llorons Torres, DO 06/30/20 2:09 PM

## 2020-06-30 NOTE — Telephone Encounter (Signed)
Nurse Assessment Nurse: Zenia Resides, RN, Diane Date/Time Eilene Ghazi Time): 06/30/2020 8:48:20 AM Confirm and document reason for call. If symptomatic, describe symptoms. ---Caller states she was diagnosed with Covid last Saturday. Caller states she has ear pain on and off and rash on her hands/wrist. Hydrocortisone cream helped a little. No new lotions or hand soap. No chest pain or pressure. No sob. No fever. Does the patient have any new or worsening symptoms? ---Yes Will a triage be completed? ---Yes Related visit to physician within the last 2 weeks? ---No Does the PT have any chronic conditions? (i.e. diabetes, asthma, this includes High risk factors for pregnancy, etc.) ---Yes List chronic conditions. ---asthma, HTN Is the patient pregnant or possibly pregnant? (Ask all females between the ages of 81-55) ---No Is this a behavioral health or substance abuse call? ---No Guidelines Guideline Title Affirmed Question Affirmed Notes Nurse Date/Time (Eastern Time) Rash or Redness - Localized [1] Severe localized itching AND [2] after Zenia Resides, RN, Diane 06/30/2020 8:50:32 AM PLEASE NOTE: All timestamps contained within this report are represented as [place] Standard Time. CONFIDENTIALTY NOTICE: This fax transmission is intended only for the addressee. It contains information that is legally privileged, confidential or otherwise protected from use or disclosure. If you are not the intended recipient, you are strictly prohibited from reviewing, disclosing, copying using or disseminating any of this information or taking any action in reliance on or regarding this information. If you have received this fax in error, please notify us immediately by telephone so that we can arrange for its return to [place]. Phone: [phone], Toll-Free: [phone], Fax: [phone] Page: 2 of 2 Call Id: [id number] Guidelines Guideline Title Affirmed Question Affirmed Notes Nurse Date/Time Eilene Ghazi Time) 2 days of  steroid cream COVID-19 - Diagnosed or Suspected [1] COVID-19 diagnosed by positive lab test (e.g., PCR, rapid self-test kit) AND [2] mild symptoms (e.g., cough, fever, others) AND [5] no complications or SOB Zenia Resides, RN, Diane 06/30/2020 8:53:31 AM Disp. Time Eilene Ghazi Time) Disposition Final User 06/30/2020 8:53:19 AM SEE PCP WITHIN 3 DAYS Zenia Resides, RN, Diane 06/30/2020 8:55:46 AM Call PCP when Office is Open Yes Zenia Resides, RN, Diane Disposition Overriden: Home Care Override Reason: Patient's symptoms need a higher level of care Caller Disagree/Comply Comply Caller Understands Yes PreDisposition Did not know what to do Care Advice Given Per Guideline SEE PCP WITHIN 3 DAYS: * You need to be seen within 2 or 3 days. COLD PACK FOR MILD ITCHING OR MILD PAIN: * Apply ice or soak in cold water for 20 minutes every 3 or 4 hours. HYDROCORTISONE CREAM FOR ITCHING: * You can use hydrocortisone for very itchy spots. * Put 1% hydrocortisone cream on the itchy area(s) 3 times a day. Use it for a couple days, until it feels better. This will help decrease the itching. ANTIHISTAMINE MEDICINES FOR MODERATE TO SEVERE ITCHING: * For moderate to severe itching, take either cetirizine or loratadine. * CETIRIZINE (REACTINE, ZYRTEC): The adult dose is 10 mg and you take it once a day. Cetirizine is available in the [place] as Zyrtec and in [place] as Reactine. * LORATADINE (ALAVERT, CLARITIN): The adult dose is 10 mg and you take it once a day. Loratadine is available in the [place] as Engineer, maintenance (IT); it is available in [place] as Claritin. CALL BACK IF: * Rash becomes worse. CARE ADVICE given per Rash - Localized and Cause Unknown (Adult) guideline. CALL PCP WHEN OFFICE IS OPEN: * You need to discuss this with your doctor (or NP/PA) within  the next few days. CALL BACK IF: * Fever over 103 F (39.4 C) * Fever lasts over 3 days * Chest pain or difficulty breathing occurs Referrals REFERRED TO PCP  OFFICE  Pt is scheduled w/ Dr. Nani Ravens today.

## 2020-07-22 ENCOUNTER — Encounter: Payer: Self-pay | Admitting: Family

## 2020-07-22 NOTE — Telephone Encounter (Signed)
Katelyn Wilcox,   She has new pt appt in June however are you ok if I schedule her to see you for acute visit prior to? Just let me know your thoughts.

## 2020-09-02 ENCOUNTER — Encounter: Payer: Self-pay | Admitting: Family

## 2020-09-02 ENCOUNTER — Ambulatory Visit: Payer: 59 | Admitting: Family

## 2020-09-02 ENCOUNTER — Other Ambulatory Visit: Payer: Self-pay

## 2020-09-02 VITALS — BP 100/62 | HR 73 | Temp 98.2°F | Ht 64.0 in | Wt 221.8 lb

## 2020-09-02 DIAGNOSIS — I10 Essential (primary) hypertension: Secondary | ICD-10-CM

## 2020-09-02 DIAGNOSIS — J4520 Mild intermittent asthma, uncomplicated: Secondary | ICD-10-CM

## 2020-09-02 DIAGNOSIS — B372 Candidiasis of skin and nail: Secondary | ICD-10-CM | POA: Diagnosis not present

## 2020-09-02 LAB — CBC WITH DIFFERENTIAL/PLATELET
Basophils Absolute: 0.1 10*3/uL (ref 0.0–0.1)
Basophils Relative: 2 % (ref 0.0–3.0)
Eosinophils Absolute: 0.5 10*3/uL (ref 0.0–0.7)
Eosinophils Relative: 8.2 % — ABNORMAL HIGH (ref 0.0–5.0)
HCT: 44.3 % (ref 36.0–46.0)
Hemoglobin: 15 g/dL (ref 12.0–15.0)
Lymphocytes Relative: 24.7 % (ref 12.0–46.0)
Lymphs Abs: 1.4 10*3/uL (ref 0.7–4.0)
MCHC: 34 g/dL (ref 30.0–36.0)
MCV: 93.9 fl (ref 78.0–100.0)
Monocytes Absolute: 0.4 10*3/uL (ref 0.1–1.0)
Monocytes Relative: 6.7 % (ref 3.0–12.0)
Neutro Abs: 3.4 10*3/uL (ref 1.4–7.7)
Neutrophils Relative %: 58.4 % (ref 43.0–77.0)
Platelets: 332 10*3/uL (ref 150.0–400.0)
RBC: 4.72 Mil/uL (ref 3.87–5.11)
RDW: 13 % (ref 11.5–15.5)
WBC: 5.8 10*3/uL (ref 4.0–10.5)

## 2020-09-02 LAB — COMPREHENSIVE METABOLIC PANEL
ALT: 13 U/L (ref 0–35)
AST: 13 U/L (ref 0–37)
Albumin: 4.7 g/dL (ref 3.5–5.2)
Alkaline Phosphatase: 98 U/L (ref 39–117)
BUN: 11 mg/dL (ref 6–23)
CO2: 31 mEq/L (ref 19–32)
Calcium: 10 mg/dL (ref 8.4–10.5)
Chloride: 100 mEq/L (ref 96–112)
Creatinine, Ser: 0.83 mg/dL (ref 0.40–1.20)
GFR: 85.1 mL/min (ref >60.00)
Glucose, Bld: 100 mg/dL — ABNORMAL HIGH (ref 70–99)
Potassium: 3.8 mEq/L (ref 3.5–5.1)
Sodium: 139 mEq/L (ref 135–145)
Total Bilirubin: 1.3 mg/dL — ABNORMAL HIGH (ref 0.2–1.2)
Total Protein: 7.3 g/dL (ref 6.0–8.3)

## 2020-09-02 LAB — HEMOGLOBIN A1C: Hgb A1c MFr Bld: 5.5 % (ref 4.6–6.5)

## 2020-09-02 MED ORDER — PREDNISONE 20 MG PO TABS: 20.0000 mg | ORAL_TABLET | Freq: Every day | ORAL | 0 refills | Status: DC

## 2020-09-02 MED ORDER — FLUTICASONE PROPIONATE 50 MCG/ACT NA SUSP: NASAL | 3 refills | Status: DC

## 2020-09-02 MED ORDER — ALBUTEROL SULFATE (2.5 MG/3ML) 0.083% IN NEBU: 2.5000 mg | INHALATION_SOLUTION | Freq: Four times a day (QID) | RESPIRATORY_TRACT | 1 refills | Status: AC | PRN

## 2020-09-02 MED ORDER — FLUCONAZOLE 100 MG PO TABS: 100.0000 mg | ORAL_TABLET | Freq: Every day | ORAL | 0 refills | Status: DC

## 2020-09-02 MED ORDER — NYSTATIN-TRIAMCINOLONE 100000-0.1 UNIT/GM-% EX CREA: 1.0000 "application " | TOPICAL_CREAM | Freq: Two times a day (BID) | CUTANEOUS | 0 refills | Status: DC

## 2020-09-02 MED ORDER — CHLORTHALIDONE 25 MG PO TABS: ORAL_TABLET | ORAL | 3 refills | Status: DC

## 2020-09-02 MED ORDER — BUDESONIDE-FORMOTEROL FUMARATE 160-4.5 MCG/ACT IN AERO: 2.0000 | INHALATION_SPRAY | Freq: Two times a day (BID) | RESPIRATORY_TRACT | 3 refills | Status: DC

## 2020-09-02 NOTE — Progress Notes (Signed)
Katelyn Wilcox is a 45 y.o. female with the following history as recorded in Epic[facility]:  Patient Active Problem List   Diagnosis Date Noted   Acute bacterial sinusitis 01/21/2014   Obesity (BMI 30-39.9) 08/25/2013   HTN (hypertension) 08/21/2012   Depression 12/01/2009   URI 12/01/2009   Asthma in adult 02/27/2008    Current Outpatient Medications  Medication Sig Dispense Refill   albuterol (PROVENTIL) (2.5 MG/3ML) 0.083% nebulizer solution Take 3 mLs (2.5 mg total) by nebulization every 6 (six) hours as needed for wheezing or shortness of breath. 150 mL 1   albuterol (VENTOLIN HFA) 108 (90 Base) MCG/ACT inhaler Inhale 2 puffs into the lungs every 6 (six) hours as needed for wheezing or shortness of breath. 18 g 3   budesonide-formoterol (SYMBICORT) 160-4.5 MCG/ACT inhaler Inhale 2 puffs into the lungs 2 (two) times daily. 1 each 3   budesonide-formoterol (SYMBICORT) 80-4.5 MCG/ACT inhaler Inhale 2 puffs into the lungs 2 (two) times daily.     cetirizine (ZYRTEC) 10 MG tablet Take 1 tablet by mouth daily.     chlorthalidone (HYGROTON) 25 MG tablet TAKE 1 TABLET(25 MG) BY MOUTH DAILY 90 tablet 3   fluconazole (DIFLUCAN) 100 MG tablet Take 1 tablet (100 mg total) by mouth daily. 10 tablet 0   montelukast (SINGULAIR) 10 MG tablet Take 10 mg by mouth at bedtime.     nystatin-triamcinolone (MYCOLOG II) cream Apply 1 application topically 2 (two) times daily. 60 g 0   predniSONE (DELTASONE) 20 MG tablet Take 1 tablet (20 mg total) by mouth daily with breakfast. 5 tablet 0   sertraline (ZOLOFT) 50 MG tablet Take 1 tablet (50 mg total) by mouth daily. 30 tablet 3   valACYclovir (VALTREX) 1000 MG tablet Take 1 tablet (1,000 mg total) by mouth 3 (three) times daily. 30 tablet 5   fluticasone (FLONASE) 50 MCG/ACT nasal spray 2 spray by Each Nare route daily. 16 g 3   No current facility-administered medications for this visit.    Allergies: Penicillins  Past Medical History:  Diagnosis Date    Asthma    Hypertension     Past Surgical History:  Procedure Laterality Date   INTRAUTERINE DEVICE INSERTION  07/12/2016   Mirena   NASAL SEPTUM SURGERY      Family History  Problem Relation Age of Onset   Stroke Mother    Hypertension Mother    Diabetes Father    Hypertension Father    Aortic dissection Father    Thyroid disease Unknown    Thyroid cancer Unknown        Paternal cousin   Aortic aneurysm Unknown    Hashimoto's thyroiditis Sister    Stroke Maternal Grandmother    Stroke Maternal Grandfather    Stroke Paternal Grandmother    Dementia Paternal Grandfather    Diabetes Paternal Aunt    Stroke Maternal Uncle    Ehlers-Danlos syndrome Son     Social History   Tobacco Use   Smoking status: Never   Smokeless tobacco: Never  Substance Use Topics   Alcohol use: Yes    Comment: rare    Subjective:   Presents to re-establish care;  History of hypertension- stable on Chlorthalidone; has been on this regimen for 10+ years; seemed to start after end of last pregnancy;   Feels like her asthma has been "flared" up since the end of May when she had COVID; adult onset asthma at age 10; notes she is having increased problems around 4  am; in 2018- she did have to go to ER with asthma flare;  History of anxiety- stable on Sertraline;     Objective:  Vitals:   09/02/20 1014  BP: 100/62  Pulse: 73  Temp: 98.2 F (36.8 C)  TempSrc: Oral  SpO2: 99%  Weight: 221 lb 12.8 oz (100.6 kg)  Height: 5' 4"  (1.626 m)    General: Well developed, well nourished, in no acute distress  Skin : Warm and dry. Erythema in left axillary region Head: Normocephalic and atraumatic  Eyes: Sclera and conjunctiva clear; pupils round and reactive to light; extraocular movements intact  Ears: External normal; canals clear; tympanic membranes normal  Oropharynx: Pink, supple. No suspicious lesions  Neck: Supple without thyromegaly, adenopathy  Lungs: Respirations unlabored; wheezing  noted; CVS exam: normal rate and regular rhythm.  Abdomen: Soft; nontender; nondistended; normoactive bowel sounds; no masses or hepatosplenomegaly  Musculoskeletal: No deformities; no active joint inflammation  Extremities: No edema, cyanosis, clubbing  Vessels: Symmetric bilaterally  Neurologic: Alert and oriented; speech intact; face symmetrical; moves all extremities well; CNII-XII intact without focal deficit  Assessment:  1. Essential hypertension   2. Mild intermittent asthma in adult without complication   3. Yeast dermatitis     Plan:  Stable; refill updated; Rx for Prednisone 20 mg qd x 5 days; increase Symbicort 160 2 puffs bid; follow up in 2 months to re-check; Check CBC, CMP, hgba1c; Rx for Diflucan 100 mg qd x 10 days, Mycolog II bid to affected area;   This visit occurred during the SARS-CoV-2 public health emergency.  Safety protocols were in place, including screening questions prior to the visit, additional usage of staff PPE, and extensive cleaning of exam room while observing appropriate contact time as indicated for disinfecting solutions.    Return in about 2 months (around 11/02/2020).  Orders Placed This Encounter  Procedures   CBC with Differential/Platelet   Comp Met (CMET)   Hemoglobin A1c     Requested Prescriptions   Signed Prescriptions Disp Refills   chlorthalidone (HYGROTON) 25 MG tablet 90 tablet 3    Sig: TAKE 1 TABLET(25 MG) BY MOUTH DAILY   albuterol (PROVENTIL) (2.5 MG/3ML) 0.083% nebulizer solution 150 mL 1    Sig: Take 3 mLs (2.5 mg total) by nebulization every 6 (six) hours as needed for wheezing or shortness of breath.   predniSONE (DELTASONE) 20 MG tablet 5 tablet 0    Sig: Take 1 tablet (20 mg total) by mouth daily with breakfast.   budesonide-formoterol (SYMBICORT) 160-4.5 MCG/ACT inhaler 1 each 3    Sig: Inhale 2 puffs into the lungs 2 (two) times daily.   fluticasone (FLONASE) 50 MCG/ACT nasal spray 16 g 3    Sig: 2 spray by Each  Nare route daily.   fluconazole (DIFLUCAN) 100 MG tablet 10 tablet 0    Sig: Take 1 tablet (100 mg total) by mouth daily.   nystatin-triamcinolone (MYCOLOG II) cream 60 g 0    Sig: Apply 1 application topically 2 (two) times daily.

## 2020-09-02 NOTE — Progress Notes (Signed)
Mam and PAP Abstracted from [facility].

## 2020-11-02 ENCOUNTER — Encounter: Payer: Self-pay | Admitting: Family

## 2020-11-02 ENCOUNTER — Other Ambulatory Visit: Payer: Self-pay

## 2020-11-02 ENCOUNTER — Ambulatory Visit: Payer: 59 | Admitting: Family

## 2020-11-02 VITALS — BP 122/60 | HR 81 | Temp 98.3°F | Ht 64.0 in | Wt 217.4 lb

## 2020-11-02 DIAGNOSIS — F32A Depression, unspecified: Secondary | ICD-10-CM

## 2020-11-02 DIAGNOSIS — I10 Essential (primary) hypertension: Secondary | ICD-10-CM | POA: Diagnosis not present

## 2020-11-02 DIAGNOSIS — Z6837 Body mass index (BMI) 37.0-37.9, adult: Secondary | ICD-10-CM | POA: Diagnosis not present

## 2020-11-02 DIAGNOSIS — J4520 Mild intermittent asthma, uncomplicated: Secondary | ICD-10-CM | POA: Diagnosis not present

## 2020-11-02 MED ORDER — SERTRALINE HCL 50 MG PO TABS: 50.0000 mg | ORAL_TABLET | Freq: Every day | ORAL | 3 refills | Status: DC

## 2020-11-02 MED ORDER — MONTELUKAST SODIUM 10 MG PO TABS: 10.0000 mg | ORAL_TABLET | Freq: Every day | ORAL | 3 refills | Status: DC

## 2020-11-02 MED ORDER — BUDESONIDE-FORMOTEROL FUMARATE 160-4.5 MCG/ACT IN AERO: 2.0000 | INHALATION_SPRAY | Freq: Two times a day (BID) | RESPIRATORY_TRACT | 5 refills | Status: DC

## 2020-11-02 NOTE — Progress Notes (Signed)
Katelyn Wilcox is a 45 y.o. female with the following history as recorded in EpicCare:  Patient Active Problem List   Diagnosis Date Noted   Acute bacterial sinusitis 01/21/2014   Obesity (BMI 30-39.9) 08/25/2013   HTN (hypertension) 08/21/2012   Depression 12/01/2009   URI 12/01/2009   Asthma in adult 02/27/2008    Current Outpatient Medications  Medication Sig Dispense Refill   albuterol (PROVENTIL) (2.5 MG/3ML) 0.083% nebulizer solution Take 3 mLs (2.5 mg total) by nebulization every 6 (six) hours as needed for wheezing or shortness of breath. 150 mL 1   albuterol (VENTOLIN HFA) 108 (90 Base) MCG/ACT inhaler Inhale 2 puffs into the lungs every 6 (six) hours as needed for wheezing or shortness of breath. 18 g 3   cetirizine (ZYRTEC) 10 MG tablet Take 1 tablet by mouth daily.     chlorthalidone (HYGROTON) 25 MG tablet TAKE 1 TABLET(25 MG) BY MOUTH DAILY 90 tablet 3   fluticasone (FLONASE) 50 MCG/ACT nasal spray 2 spray by Each Nare route daily. 16 g 3   nystatin-triamcinolone (MYCOLOG II) cream Apply 1 application topically 2 (two) times daily. 60 g 0   valACYclovir (VALTREX) 1000 MG tablet Take 1 tablet (1,000 mg total) by mouth 3 (three) times daily. 30 tablet 5   budesonide-formoterol (SYMBICORT) 160-4.5 MCG/ACT inhaler Inhale 2 puffs into the lungs 2 (two) times daily. 1 each 5   montelukast (SINGULAIR) 10 MG tablet Take 1 tablet (10 mg total) by mouth at bedtime. 90 tablet 3   sertraline (ZOLOFT) 50 MG tablet Take 1 tablet (50 mg total) by mouth daily. 90 tablet 3   No current facility-administered medications for this visit.    Allergies: Penicillins  Past Medical History:  Diagnosis Date   Asthma    Hypertension     Past Surgical History:  Procedure Laterality Date   INTRAUTERINE DEVICE INSERTION  07/12/2016   Mirena   NASAL SEPTUM SURGERY      Family History  Problem Relation Age of Onset   Stroke Mother    Hypertension Mother    Diabetes Father    Hypertension  Father    Aortic dissection Father    Thyroid disease Unknown    Thyroid cancer Unknown        Paternal cousin   Aortic aneurysm Unknown    Hashimoto's thyroiditis Sister    Stroke Maternal Grandmother    Stroke Maternal Grandfather    Stroke Paternal Grandmother    Dementia Paternal Grandfather    Diabetes Paternal Aunt    Stroke Maternal Uncle    Ehlers-Danlos syndrome Son     Social History   Tobacco Use   Smoking status: Never   Smokeless tobacco: Never  Substance Use Topics   Alcohol use: Yes    Comment: rare    Subjective:   2 month follow up on asthma; at last OV, Symbicort was increased to 160/4.5 2 puffs bid; has been feeling better with increased dose of medication; has not had to use nebulizer since last OV;   Also concerned about weight/ weight loss options; readily admits she "knows what she needs to do"/ "just not doing it."   Objective:  Vitals:   11/02/20 0958  BP: 122/60  Pulse: 81  Temp: 98.3 F (36.8 C)  TempSrc: Oral  SpO2: 97%  Weight: 217 lb 6.4 oz (98.6 kg)  Height: 5\' 4"  (1.626 m)    General: Well developed, well nourished, in no acute distress  Skin : Warm and  dry.  Head: Normocephalic and atraumatic  Eyes: Sclera and conjunctiva clear; pupils round and reactive to light; extraocular movements intact  Ears: External normal; canals clear; tympanic membranes normal  Oropharynx: Pink, supple. No suspicious lesions  Neck: Supple without thyromegaly, adenopathy  Lungs: Respirations unlabored; clear to auscultation bilaterally without wheeze, rales, rhonchi  CVS exam: normal rate and regular rhythm.  Neurologic: Alert and oriented; speech intact; face symmetrical; moves all extremities well; CNII-XII intact without focal deficit   Assessment:  1. Mild intermittent asthma in adult without complication   2. BMI 37.0-37.9, adult   3. Essential hypertension   4. Depression, unspecified depression type     Plan:  Improved on Symbicort  160/4.5; re-check in 4-6 months; to consider allergy testing; Refer to healthy weight and wellness; Stable; Stable; refill updated;   Return in about 5 months (around 04/04/2021).  Orders Placed This Encounter  Procedures   Amb Ref to Medical Weight Management    Referral Priority:   Routine    Referral Type:   Consultation    Number of Visits Requested:   1    Requested Prescriptions   Signed Prescriptions Disp Refills   budesonide-formoterol (SYMBICORT) 160-4.5 MCG/ACT inhaler 1 each 5    Sig: Inhale 2 puffs into the lungs 2 (two) times daily.   sertraline (ZOLOFT) 50 MG tablet 90 tablet 3    Sig: Take 1 tablet (50 mg total) by mouth daily.   montelukast (SINGULAIR) 10 MG tablet 90 tablet 3    Sig: Take 1 tablet (10 mg total) by mouth at bedtime.

## 2020-12-07 ENCOUNTER — Other Ambulatory Visit: Payer: Self-pay | Admitting: Family

## 2021-04-05 ENCOUNTER — Ambulatory Visit: Payer: 59 | Admitting: Family

## 2021-04-05 ENCOUNTER — Ambulatory Visit (INDEPENDENT_AMBULATORY_CARE_PROVIDER_SITE_OTHER): Payer: 59 | Admitting: Family

## 2021-04-05 VITALS — BP 122/64 | HR 65 | Temp 97.8°F | Resp 18 | Ht 64.0 in | Wt 228.4 lb

## 2021-04-05 DIAGNOSIS — F32A Depression, unspecified: Secondary | ICD-10-CM | POA: Diagnosis not present

## 2021-04-05 DIAGNOSIS — Z1231 Encounter for screening mammogram for malignant neoplasm of breast: Secondary | ICD-10-CM | POA: Diagnosis not present

## 2021-04-05 DIAGNOSIS — R69 Illness, unspecified: Secondary | ICD-10-CM | POA: Diagnosis not present

## 2021-04-05 DIAGNOSIS — J4520 Mild intermittent asthma, uncomplicated: Secondary | ICD-10-CM | POA: Diagnosis not present

## 2021-04-05 DIAGNOSIS — I10 Essential (primary) hypertension: Secondary | ICD-10-CM

## 2021-04-05 MED ORDER — FLUTICASONE PROPIONATE 50 MCG/ACT NA SUSP: NASAL | 3 refills | Status: AC

## 2021-04-05 MED ORDER — CHLORTHALIDONE 25 MG PO TABS: ORAL_TABLET | ORAL | 3 refills | Status: DC

## 2021-04-05 MED ORDER — SERTRALINE HCL 50 MG PO TABS: 50.0000 mg | ORAL_TABLET | Freq: Every day | ORAL | 3 refills | Status: DC

## 2021-04-05 MED ORDER — MONTELUKAST SODIUM 10 MG PO TABS: ORAL_TABLET | ORAL | 3 refills | Status: DC

## 2021-04-05 MED ORDER — BUDESONIDE-FORMOTEROL FUMARATE 160-4.5 MCG/ACT IN AERO: 2.0000 | INHALATION_SPRAY | Freq: Two times a day (BID) | RESPIRATORY_TRACT | 3 refills | Status: DC

## 2021-04-05 NOTE — Progress Notes (Signed)
Katelyn Wilcox is a 46 y.o. female with the following history as recorded in EpicCare:  Patient Active Problem List   Diagnosis Date Noted   Acute bacterial sinusitis 01/21/2014   Obesity (BMI 30-39.9) 08/25/2013   HTN (hypertension) 08/21/2012   Depression 12/01/2009   URI 12/01/2009   Asthma in adult 02/27/2008    Current Outpatient Medications  Medication Sig Dispense Refill   albuterol (PROVENTIL) (2.5 MG/3ML) 0.083% nebulizer solution Take 3 mLs (2.5 mg total) by nebulization every 6 (six) hours as needed for wheezing or shortness of breath. 150 mL 1   albuterol (VENTOLIN HFA) 108 (90 Base) MCG/ACT inhaler Inhale 2 puffs into the lungs every 6 (six) hours as needed for wheezing or shortness of breath. 18 g 3   cetirizine (ZYRTEC) 10 MG tablet Take 1 tablet by mouth daily.     nystatin-triamcinolone (MYCOLOG II) cream Apply 1 application topically 2 (two) times daily. 60 g 0   valACYclovir (VALTREX) 1000 MG tablet Take 1 tablet (1,000 mg total) by mouth 3 (three) times daily. 30 tablet 5   budesonide-formoterol (SYMBICORT) 160-4.5 MCG/ACT inhaler Inhale 2 puffs into the lungs 2 (two) times daily. 3 each 3   chlorthalidone (HYGROTON) 25 MG tablet TAKE 1 TABLET(25 MG) BY MOUTH DAILY 90 tablet 3   fluticasone (FLONASE) 50 MCG/ACT nasal spray 2 spray by Each Nare route daily. 16 g 3   montelukast (SINGULAIR) 10 MG tablet TAKE 1 TABLET(10 MG) BY MOUTH AT BEDTIME 90 tablet 3   sertraline (ZOLOFT) 50 MG tablet Take 1 tablet (50 mg total) by mouth daily. 90 tablet 3   No current facility-administered medications for this visit.    Allergies: Penicillins  Past Medical History:  Diagnosis Date   Asthma    Hypertension     Past Surgical History:  Procedure Laterality Date   INTRAUTERINE DEVICE INSERTION  07/12/2016   Mirena   NASAL SEPTUM SURGERY      Family History  Problem Relation Age of Onset   Stroke Mother    Hypertension Mother    Diabetes Father    Hypertension Father     Aortic dissection Father    Thyroid disease Unknown    Thyroid cancer Unknown        Paternal cousin   Aortic aneurysm Unknown    Hashimoto's thyroiditis Sister    Stroke Maternal Grandmother    Stroke Maternal Grandfather    Stroke Paternal Grandmother    Dementia Paternal Grandfather    Diabetes Paternal Aunt    Stroke Maternal Uncle    Ehlers-Danlos syndrome Son     Social History   Tobacco Use   Smoking status: Never   Smokeless tobacco: Never  Substance Use Topics   Alcohol use: Yes    Comment: rare    Subjective:   5 month follow up on chronic care needs; Asthma has been doing very well on Symbicort; has been out of medication for past few days and can tell her breathing is not doing well; has only heard occasional wheezing since being off medication for past few days.  Needs all refills updated to new pharmacy- has to now use [facility];   Overdue for mammogram and colon cancer screening; last pap smear 2021;    Objective:  Vitals:   04/05/21 0901  BP: 122/64  Pulse: 65  Resp: 18  Temp: 97.8 F (36.6 C)  TempSrc: Oral  SpO2: 95%  Weight: 228 lb 6.4 oz (103.6 kg)  Height: 5\' 4"  (1.626  m)    General: Well developed, well nourished, in no acute distress  Skin : Warm and dry.  Head: Normocephalic and atraumatic  Eyes: Sclera and conjunctiva clear; pupils round and reactive to light; extraocular movements intact  Lungs: Respirations unlabored; occasional wheeze noted; CVS exam: normal rate and regular rhythm.  Abdomen: Soft; nontender; nondistended; normoactive bowel sounds; no masses or hepatosplenomegaly  Musculoskeletal: No deformities; no active joint inflammation  Extremities: No edema, cyanosis, clubbing  Vessels: Symmetric bilaterally  Neurologic: Alert and oriented; speech intact; face symmetrical; moves all extremities well; CNII-XII intact without focal deficit   Assessment:  1. Essential hypertension   2. Visit for screening mammogram   3. Mild  intermittent asthma in adult without complication   4. Depression, unspecified depression type     Plan:  Stable; refill updated; Order updated; Refill updated on Symbicort; if patient continues to hear wheezing after she has been on medication for a few days, she will call back and to consider short prednisone burst; per patient, she has been doing great until recently when insurance would not fill medicaiton; Stable; refill updated;  Information given regarding Cologuard- she will check to make sure insurance covers and call back to schedule;  Plan for CPE in 6 months, sooner prn.  This visit occurred during the SARS-CoV-2 public health emergency.  Safety protocols were in place, including screening questions prior to the visit, additional usage of staff PPE, and extensive cleaning of exam room while observing appropriate contact time as indicated for disinfecting solutions.    No follow-ups on file.  Orders Placed This Encounter  Procedures   MM Digital Screening    Standing Status:   Future    Standing Expiration Date:   04/05/2022    Order Specific Question:   Reason for Exam (SYMPTOM  OR DIAGNOSIS REQUIRED)    Answer:   screening mammogram    Order Specific Question:   Is the patient pregnant?    Answer:   No    Order Specific Question:   Preferred imaging location?    Answer:   [facility]    Requested Prescriptions   Signed Prescriptions Disp Refills   budesonide-formoterol (SYMBICORT) 160-4.5 MCG/ACT inhaler 3 each 3    Sig: Inhale 2 puffs into the lungs 2 (two) times daily.   chlorthalidone (HYGROTON) 25 MG tablet 90 tablet 3    Sig: TAKE 1 TABLET(25 MG) BY MOUTH DAILY   montelukast (SINGULAIR) 10 MG tablet 90 tablet 3    Sig: TAKE 1 TABLET(10 MG) BY MOUTH AT BEDTIME   fluticasone (FLONASE) 50 MCG/ACT nasal spray 16 g 3    Sig: 2 spray by Each Nare route daily.   sertraline (ZOLOFT) 50 MG tablet 90 tablet 3    Sig: Take 1 tablet (50 mg total) by mouth daily.

## 2021-04-12 ENCOUNTER — Other Ambulatory Visit: Payer: Self-pay

## 2021-04-12 ENCOUNTER — Ambulatory Visit (HOSPITAL_BASED_OUTPATIENT_CLINIC_OR_DEPARTMENT_OTHER)
Admission: RE | Admit: 2021-04-12 | Discharge: 2021-04-12 | Disposition: A | Discharge status: Home / Self Care | Payer: 59 | Source: Ambulatory Visit | Attending: Family | Admitting: Family

## 2021-04-12 ENCOUNTER — Encounter (HOSPITAL_BASED_OUTPATIENT_CLINIC_OR_DEPARTMENT_OTHER): Payer: Self-pay

## 2021-04-12 DIAGNOSIS — Z1231 Encounter for screening mammogram for malignant neoplasm of breast: Secondary | ICD-10-CM | POA: Diagnosis not present

## 2021-04-28 ENCOUNTER — Encounter: Payer: Self-pay | Admitting: Family Medicine

## 2021-04-28 ENCOUNTER — Telehealth (INDEPENDENT_AMBULATORY_CARE_PROVIDER_SITE_OTHER): Payer: 59 | Admitting: Family Medicine

## 2021-04-28 DIAGNOSIS — H9203 Otalgia, bilateral: Secondary | ICD-10-CM

## 2021-04-28 MED ORDER — PREDNISONE 20 MG PO TABS: 40.0000 mg | ORAL_TABLET | Freq: Every day | ORAL | 0 refills | Status: AC

## 2021-04-28 MED ORDER — CIPROFLOXACIN-DEXAMETHASONE 0.3-0.1 % OT SUSP: 4.0000 [drp] | Freq: Two times a day (BID) | OTIC | 0 refills | Status: AC

## 2021-04-28 NOTE — Progress Notes (Signed)
Virtual Video Visit via MyChart Note  I connected with  Katelyn Wilcox on 04/28/21 at  2:20 PM EST by the video enabled telemedicine application for MyChart, and verified that I am speaking with the correct person using two identifiers.   I introduced myself as a Designer, jewellery with the practice. We discussed the limitations of evaluation and management by telemedicine and the availability of in person appointments. The patient expressed understanding and agreed to proceed.  Participating parties in this visit include: The patient and the nurse practitioner listed.  The patient is: At home I am: at home   Subjective:    CC: bilateral ear pain  HPI: Katelyn Wilcox is a 46 y.o. year old female presenting today via [facility] today for ear pain.  Patient reports she has been having intermittent bilateral ear pain for the past several week with the right ear seeming to be worse than then left. However, over the past week, ear pain has been worse and more constant, left ear has started feeling worse than right. She describes the pain as a constant 4-5/10 dull ache with occasional 7-8/10 sharp pain. She has a history of allergic rhinitis and has been taking daily flonase and zyrtec for several weeks now. She will also have occasional headaches, but no other URI symptoms. She denies any fevers, chills, body aches, drainage, itchy/watery eyes, sneezing chest pain, dyspnea.       Past medical history, Surgical history, Family history not pertinant except as noted below, Social history, Allergies, and medications have been entered into the medical record, reviewed, and corrections made.   Review of Systems:  All review of systems negative except what is listed in the HPI   Objective:    General:  Speaking clearly in complete sentences. Absent shortness of breath noted.   Alert and oriented x3.   Normal judgment.  Absent acute distress.   Impression and Recommendations:    1. Ear pain,  bilateral - predniSONE (DELTASONE) 20 MG tablet; Take 2 tablets (40 mg total) by mouth daily with breakfast for 5 days.  Dispense: 10 tablet; Refill: 0 - ciprofloxacin-dexamethasone (CIPRODEX) OTIC suspension; Place 4 drops into both ears 2 (two) times daily for 7 days.  Dispense: 7.5 mL; Refill: 0  Difficult to fully diagnose without begin able to evaluate TMs. Potentially middle ear effusions given history of allergies. She has not responded to the flonase will so give a prednisone burst to see if this helps. Will also go ahead and send in ABX drop in case no improvement after a few days of starting the steroids. If she fails to improve after trying both of these, recommend in-person eval so we can look at ears. Patient aware of signs/symptoms requiring further/urgent evaluation.       Follow-up if symptoms worsen or fail to improve.    I discussed the assessment and treatment plan with the patient. The patient was provided an opportunity to ask questions and all were answered. The patient agreed with the plan and demonstrated an understanding of the instructions.   The patient was advised to call back or seek an in-person evaluation if the symptoms worsen or if the condition fails to improve as anticipated.  I spent 20 minutes dedicated to the care of this patient on the date of this encounter to include pre-visit chart review of prior notes and results, face-to-face time with the patient, and post-visit ordering of testing as indicated.   Terrilyn Saver, NP

## 2021-04-28 NOTE — Progress Notes (Signed)
Bilateral ear pain, right is worse Off and on for a month Some allergy sx

## 2021-06-20 ENCOUNTER — Encounter: Payer: Self-pay | Admitting: Family

## 2021-06-24 ENCOUNTER — Encounter: Payer: Self-pay | Admitting: Radiology

## 2021-06-24 ENCOUNTER — Ambulatory Visit: Payer: 59 | Admitting: Radiology

## 2021-06-24 VITALS — BP 136/98 | Ht 63.75 in | Wt 225.0 lb

## 2021-06-24 DIAGNOSIS — N926 Irregular menstruation, unspecified: Secondary | ICD-10-CM

## 2021-06-24 NOTE — Progress Notes (Signed)
? ? ? ? ? ? ?  Irregular Vaginal bleeding ? ?Subjective: ? ?46yo G3P0 presents with c/o irregular bleeding x1 week.  Patient's last menstrual period was 04/14/2017. FSH in 2019 was 111.4. Bleeding began suddenly 1 week ago, bright red, heavy like a period. ? ? ?Objective: ?Physical Exam ?Constitutional:   ?   Appearance: Normal appearance. She is normal weight.  ?Cardiovascular:  ?   Rate and Rhythm: Normal rate and regular rhythm.  ?Abdominal:  ?   General: Abdomen is flat.  ?   Palpations: Abdomen is soft.  ?Skin: ?   General: Skin is warm and dry.  ?Neurological:  ?   Mental Status: She is alert.  ?Psychiatric:     ?   Mood and Affect: Mood normal.  ?  ?Pelvic exam:  ?VULVA: normal appearing vulva with no masses, tenderness or lesions, VAGINA: normal appearing vagina with normal color and discharge, no lesions, moderate amount of bright red blood present ?CERVIX: normal appearing cervix without discharge or lesions,  ?UTERUS: uterus is normal size, shape, consistency and nontender,  ?ADNEXA: normal adnexa in size, nontender and no masses. ? ? ?Chaperone offered and declined for exam ? ?Assessment/Plan: ? ?1. Irregular bleeding ?Will recheck [facility] to confirm patient is post menopausal ?Schedule u/s to assess lining ?- FSH ?- US Transvaginal Non-OB; Future  ? ?

## 2021-06-25 LAB — FOLLICLE STIMULATING HORMONE: FSH: 95.7 m[IU]/mL

## 2021-07-18 ENCOUNTER — Encounter: Payer: Self-pay | Admitting: Family

## 2021-07-18 DIAGNOSIS — B001 Herpesviral vesicular dermatitis: Secondary | ICD-10-CM

## 2021-07-18 MED ORDER — VALACYCLOVIR HCL 1 G PO TABS: 1000.0000 mg | ORAL_TABLET | Freq: Three times a day (TID) | ORAL | 0 refills | Status: AC

## 2021-07-19 ENCOUNTER — Ambulatory Visit (INDEPENDENT_AMBULATORY_CARE_PROVIDER_SITE_OTHER): Payer: 59

## 2021-07-19 ENCOUNTER — Ambulatory Visit (INDEPENDENT_AMBULATORY_CARE_PROVIDER_SITE_OTHER): Payer: 59 | Admitting: Obstetrics and Gynecology

## 2021-07-19 ENCOUNTER — Encounter: Payer: Self-pay | Admitting: Obstetrics and Gynecology

## 2021-07-19 ENCOUNTER — Other Ambulatory Visit (HOSPITAL_COMMUNITY)
Admission: RE | Admit: 2021-07-19 | Discharge: 2021-07-19 | Disposition: A | Discharge status: Home / Self Care | Payer: 59 | Source: Ambulatory Visit | Attending: Obstetrics and Gynecology | Admitting: Obstetrics and Gynecology

## 2021-07-19 VITALS — BP 120/84 | Ht 63.75 in | Wt 225.0 lb

## 2021-07-19 DIAGNOSIS — R9389 Abnormal findings on diagnostic imaging of other specified body structures: Secondary | ICD-10-CM | POA: Diagnosis not present

## 2021-07-19 DIAGNOSIS — N950 Postmenopausal bleeding: Secondary | ICD-10-CM | POA: Diagnosis not present

## 2021-07-19 DIAGNOSIS — N926 Irregular menstruation, unspecified: Secondary | ICD-10-CM

## 2021-07-19 NOTE — Progress Notes (Signed)
GYNECOLOGY  VISIT ?  ?HPI: ?46 y.o.   Married  Caucasian  female   ?[id number] with Patient's last menstrual period was 04/14/2017.   ?here for pelvic ultrasound with possible endometrial biopsy. Patient with postmenopausal bleeding which began 06-17-21.  Had bleeding once and it lasted about a week.  Had clear heavy discharge prior to this.  ? ?Having hot flashes.  ? ?LMP 03-13-18. ? ?FSH 95.7 on 06/24/21. ? ?Does not take hormone therapy. ? ?Hx of an expelled [name].  ?States she had heavy menstruation prior to her periods stopping.  ? ?GYNECOLOGIC HISTORY: ?Patient's last menstrual period was 04/14/2017. ?Contraception: PMP ?Menopausal hormone therapy:  none ?Last mammogram:  04-12-21 Neg/BiRads1 ?Last pap smear:   09/22/19 - HR HVP negative 10-25-15 Neg:Neg HR HPV, 11-25-13 Neg:Neg HR HPV, 08-21-12 Neg.   ?No history of abnormal paps.  ?       ?OB History   ? ? Gravida  ?3  ? Para  ?   ? Term  ?   ? Preterm  ?   ? AB  ?0  ? Living  ?3  ?  ? ? SAB  ?   ? IAB  ?   ? Ectopic  ?0  ? Multiple  ?   ? Live Births  ?   ?   ?  ?  ?    ? ?Patient Active Problem List  ? Diagnosis Date Noted  ? Acute bacterial sinusitis 01/21/2014  ? Obesity (BMI 30-39.9) 08/25/2013  ? HTN (hypertension) 08/21/2012  ? Depression 12/01/2009  ? URI 12/01/2009  ? Asthma in adult 02/27/2008  ? ? ?Past Medical History:  ?Diagnosis Date  ? Asthma   ? Hypertension   ? ? ?Past Surgical History:  ?Procedure Laterality Date  ? INTRAUTERINE DEVICE INSERTION  07/12/2016  ? Mirena  ? NASAL SEPTUM SURGERY    ? ? ?Current Outpatient Medications  ?Medication Sig Dispense Refill  ? albuterol (PROVENTIL) (2.5 MG/3ML) 0.083% nebulizer solution Take 3 mLs (2.5 mg total) by nebulization every 6 (six) hours as needed for wheezing or shortness of breath. 150 mL 1  ? albuterol (VENTOLIN HFA) 108 (90 Base) MCG/ACT inhaler Inhale 2 puffs into the lungs every 6 (six) hours as needed for wheezing or shortness of breath. 18 g 3  ? budesonide-formoterol (SYMBICORT) 160-4.5  MCG/ACT inhaler Inhale 2 puffs into the lungs 2 (two) times daily. 3 each 3  ? cetirizine (ZYRTEC) 10 MG tablet Take 1 tablet by mouth daily.    ? chlorthalidone (HYGROTON) 25 MG tablet TAKE 1 TABLET(25 MG) BY MOUTH DAILY 90 tablet 3  ? fluticasone (FLONASE) 50 MCG/ACT nasal spray 2 spray by Each Nare route daily. 16 g 3  ? montelukast (SINGULAIR) 10 MG tablet TAKE 1 TABLET(10 MG) BY MOUTH AT BEDTIME 90 tablet 3  ? sertraline (ZOLOFT) 50 MG tablet Take 1 tablet (50 mg total) by mouth daily. 90 tablet 3  ? valACYclovir (VALTREX) 1000 MG tablet Take 1 tablet (1,000 mg total) by mouth 3 (three) times daily. As directed 30 tablet 0  ? ?No current facility-administered medications for this visit.  ?  ? ?ALLERGIES: Penicillins ? ?Family History  ?Problem Relation Age of Onset  ? Stroke Mother   ? Hypertension Mother   ? Diabetes Father   ? Hypertension Father   ? Aortic dissection Father   ? Thyroid disease Unknown   ? Thyroid cancer Unknown   ?     Paternal cousin  ?  Aortic aneurysm Unknown   ? Hashimoto's thyroiditis Sister   ? Stroke Maternal Grandmother   ? Stroke Maternal Grandfather   ? Stroke Paternal Grandmother   ? Dementia Paternal Grandfather   ? Diabetes Paternal Aunt   ? Stroke Maternal Uncle   ? Ehlers-Danlos syndrome Son   ? ? ?Social History  ? ?Socioeconomic History  ? Marital status: Married  ?  Spouse name: Not on file  ? Number of children: Not on file  ? Years of education: Not on file  ? Highest education level: Not on file  ?Occupational History  ?  Comment: special place-- wigs , prosthetics  ?Tobacco Use  ? Smoking status: Never  ? Smokeless tobacco: Never  ?Vaping Use  ? Vaping Use: Never used  ?Substance and Sexual Activity  ? Alcohol use: Yes  ?  Comment: rare  ? Drug use: No  ? Sexual activity: Yes  ?  Partners: Male  ?  Birth control/protection: Post-menopausal  ?Other Topics Concern  ? Not on file  ?Social History Narrative  ? Exercise--no  ? ?Social Determinants of Health  ? ?Financial  Resource Strain: Not on file  ?Food Insecurity: Not on file  ?Transportation Needs: Not on file  ?Physical Activity: Not on file  ?Stress: Not on file  ?Social Connections: Not on file  ?Intimate Partner Violence: Not on file  ? ? ?Review of Systems  ?Genitourinary:   ?     Postmenopausal bleeding  ?All other systems reviewed and are negative. ? ?PHYSICAL EXAMINATION:   ? ?BP 120/84   Ht 5' 3.75" (1.619 m)   Wt 225 lb (102.1 kg)   LMP 04/14/2017   BMI 38.92 kg/m?     ?General appearance: alert, cooperative and appears stated age ? ?Pelvic: External genitalia:  no lesions ?             Urethra:  normal appearing urethra with no masses, tenderness or lesions ?             Bartholins and Skenes: normal    ?             Vagina: normal appearing vagina with normal color and discharge, no lesions ?             Cervix: no lesions ?               ?Bimanual Exam:  Uterus:  normal size, contour, position, consistency, mobility, non-tender ?             Adnexa: no mass, fullness, tenderness ?        ?Pelvic US ?Uterus 6.97 x 4.82 x 4.84 cm.  ?Heterogeneous myometrium.  ?Fibroids 1.52 cm, 0.92 cm, 0.72 cm.  ?EMS 20.02 mm. ?Left ovary 2.59 x 1.54 x 1.26 cm.  Atrophic.  ?Right ovary 3.38 x 2.17 x 1.28 cm.  1.69 cm follicle. ?No adnexal masses.  ?No free fluid.  ?    ?EMB  ?Consent for procedure.  ?Sterile prep with Hibiclens.  ?Paracervical block with 10 cc 1% lidocaine, lot [id number], exp 07/12/22.  ?Tenaculum to anterior cervical lip.  ?Pipelle passed to 7 cm x 2.  ?Tissue to pathology.  ?No complications.  ?Minimal EBL. ? ?Chaperone was present for exam:  Ladona Ridgel, CMA ? ?ASSESSMENT ? ?Postmenopausal bleeding.  ?Thickened endometrium.  ?Arcuate uterus.  ? ?PLAN ? ?Causes of postmenopausal bleeding and endometrial thickening discussed:  polyp, fibroid, hyperplasia, and malignancy.  ?[place] images and report reviewed with patient.  ?Follow  up EMB. ?Final plan to follow.  ?Patient will need a pap and HR HPV testing.  ?  ?An After Visit  Summary was printed and given to the patient. ? ?25 min  total time was spent for this patient encounter, including preparation, face-to-face counseling with the patient, coordination of care, and documentation of the encounter. ? ? ?

## 2021-07-21 LAB — SURGICAL PATHOLOGY

## 2021-07-22 NOTE — Progress Notes (Signed)
46 y.o. G2P0003 Married Caucasian female here for annual exam and discussion of procedure due to PMB.   ? ?Had postmenopausal bleeding for one week starting 06/17/21.  ?Spotting again yesterday.  ? ?Pelvic US 07/19/21: ?Uterus 6.97 x 4.82 x 4.84 cm.  ?Heterogeneous myometrium.  ?Fibroids 1.52 cm, 0.92 cm, 0.72 cm.  ?EMS 20.02 mm. ?Left ovary 2.59 x 1.54 x 1.26 cm.  Atrophic.  ?Right ovary 3.38 x 2.17 x 1.28 cm.  [id number] cm follicle. ?No adnexal masses.  ?No free fluid.  ? ?EMB 07/19/21: ?Benign endometrium.  ?    ?PCP: Jodi Mourning, NP  ? ?Patient's last menstrual period was 04/14/2017.     ?  ?Sexually active: Yes.    ?The current method of family planning is post menopausal status.    ?Exercising: Yes.     3x week ?Smoker:  no ? ?Health Maintenance: ?Pap: 09/22/19-WNL, HPV- neg, 10/25/15-WNL, HPV- neg, 11/25/13-WNL, HPV- neg, 08/21/12-WNL ?History of abnormal Pap:  no ?MMG: 04/12/21-neg birads 1 ?Colonoscopy: never.  PCP is ordering Cologuard.  ?BMD:  n/a  Result  n/a ?TDaP: 08/25/13 ?Gardasil:   no ?HIV: never ?Hep C: 09/22/19- NR ?Screening Labs:  PCP. ? ? reports that she has never smoked. She has never used smokeless tobacco. She reports current alcohol use. She reports that she does not use drugs. ? ?Past Medical History:  ?Diagnosis Date  ? Asthma   ? HSV-1 infection   ? Hypertension   ? ? ?Past Surgical History:  ?Procedure Laterality Date  ? INTRAUTERINE DEVICE INSERTION  07/12/2016  ? Mirena  ? NASAL SEPTUM SURGERY    ? ? ?Current Outpatient Medications  ?Medication Sig Dispense Refill  ? albuterol (PROVENTIL) (2.5 MG/3ML) 0.083% nebulizer solution Take 3 mLs (2.5 mg total) by nebulization every 6 (six) hours as needed for wheezing or shortness of breath. 150 mL 1  ? albuterol (VENTOLIN HFA) 108 (90 Base) MCG/ACT inhaler Inhale 2 puffs into the lungs every 6 (six) hours as needed for wheezing or shortness of breath. 18 g 3  ? budesonide-formoterol (SYMBICORT) 160-4.5 MCG/ACT inhaler Inhale 2 puffs into the lungs 2 (two)  times daily. 3 each 3  ? cetirizine (ZYRTEC) 10 MG tablet Take 1 tablet by mouth daily.    ? chlorthalidone (HYGROTON) 25 MG tablet TAKE 1 TABLET(25 MG) BY MOUTH DAILY 90 tablet 3  ? fluticasone (FLONASE) 50 MCG/ACT nasal spray 2 spray by Each Nare route daily. 16 g 3  ? montelukast (SINGULAIR) 10 MG tablet TAKE 1 TABLET(10 MG) BY MOUTH AT BEDTIME 90 tablet 3  ? sertraline (ZOLOFT) 50 MG tablet Take 1 tablet (50 mg total) by mouth daily. 90 tablet 3  ? valACYclovir (VALTREX) 1000 MG tablet Take 1 tablet (1,000 mg total) by mouth 3 (three) times daily. As directed 30 tablet 0  ? ?No current facility-administered medications for this visit.  ? ? ?Family History  ?Problem Relation Age of Onset  ? Stroke Mother   ? Hypertension Mother   ? Diabetes Father   ? Hypertension Father   ? Aortic dissection Father   ? Thyroid disease Unknown   ? Thyroid cancer Unknown   ?     Paternal cousin  ? Aortic aneurysm Unknown   ? Hashimoto's thyroiditis Sister   ? Stroke Maternal Grandmother   ? Stroke Maternal Grandfather   ? Stroke Paternal Grandmother   ? Dementia Paternal Grandfather   ? Diabetes Paternal Aunt   ? Stroke Maternal Uncle   ? Ehlers-Danlos  syndrome Son   ? ? ?Review of Systems  ?Genitourinary:  Positive for vaginal bleeding.  ? ?Exam:   ?BP 118/84   Pulse 69   Ht 5' 3.25" (1.607 m)   Wt 230 lb (104.3 kg)   LMP 04/14/2017   SpO2 93%   BMI 40.42 kg/m?     ?General appearance: alert, cooperative and appears stated age ?Head: normocephalic, without obvious abnormality, atraumatic ?Neck: no adenopathy, supple, symmetrical, trachea midline and thyroid normal to inspection and palpation ?Lungs: clear to auscultation bilaterally ?Breasts: normal appearance, no masses or tenderness, No nipple retraction or dimpling, No nipple discharge or bleeding, No axillary adenopathy ?Heart: regular rate and rhythm ?Abdomen: soft, non-tender; no masses, no organomegaly ?Extremities: extremities normal, atraumatic, no cyanosis or  edema ?Skin: skin color, texture, turgor normal. No rashes or lesions ?Lymph nodes: cervical, supraclavicular, and axillary nodes normal. ?Neurologic: grossly normal ? ?Pelvic: External genitalia:  no lesions ?             No abnormal inguinal nodes palpated. ?             Urethra:  normal appearing urethra with no masses, tenderness or lesions ?             Bartholins and Skenes: normal    ?             Vagina: normal appearing vagina with normal color and discharge, no lesions ?             Cervix: no lesions.  Yellow drainage from the cervix.  ?             Pap taken: yes ?Bimanual Exam:  Uterus:  normal size, contour, position, consistency, mobility, non-tender ?             Adnexa: no mass, fullness, tenderness ?             Rectal exam: Yes.  .  Confirms. ?             Anus:  normal sphincter tone, no lesions ? ?Chaperone was present for exam:  Lovena Le, CMA ? ?Assessment:   ?Well woman visit with gynecologic exam. ?Postmenopausal bleeding.   ?Thickened endometrium.  ?Negative endometrial biopsy which does not correlate with the biopsy.  ?Arcuate uterus.  ? ?Plan: ?Mammogram screening discussed. ?Self breast awareness reviewed. ?Pap and HR HPV collected. ?Guidelines for Calcium, Vitamin D, regular exercise program including cardiovascular and weight bearing exercise. ?  ?Follow up annually and prn.  ? ?In addition to the annual exam, today, consultation performed regarding postmenopausal bleeding, thickened endometrium, and endometrial biopsy, 20 minutes duration.  ?I am recommending hysteroscopy with Myosure polypectomy, dilation and curettage.  ?Risks, benefits, and alternatives reviewed. ?Risks include but are not limited to bleeding, infection, damage to surrounding organs including uterine perforation requiring hospitalization and laparoscopy, reaction to anesthesia, pneumonia, DVT, PE, death, need for further treatment. Surgical expectations and recovery discussed.  ?Patient wishes to  proceed. ? ? ? ? ? ? ? ?

## 2021-07-25 ENCOUNTER — Telehealth: Payer: Self-pay | Admitting: Obstetrics and Gynecology

## 2021-07-25 ENCOUNTER — Ambulatory Visit: Payer: BLUE CROSS/BLUE SHIELD | Admitting: Radiology

## 2021-07-25 ENCOUNTER — Encounter: Payer: Self-pay | Admitting: Obstetrics and Gynecology

## 2021-07-25 ENCOUNTER — Ambulatory Visit (INDEPENDENT_AMBULATORY_CARE_PROVIDER_SITE_OTHER): Payer: 59 | Admitting: Obstetrics and Gynecology

## 2021-07-25 ENCOUNTER — Other Ambulatory Visit (HOSPITAL_COMMUNITY)
Admission: RE | Admit: 2021-07-25 | Discharge: 2021-07-25 | Disposition: A | Discharge status: Home / Self Care | Payer: 59 | Source: Ambulatory Visit | Attending: Radiology | Admitting: Radiology

## 2021-07-25 VITALS — BP 118/84 | HR 69 | Ht 63.25 in | Wt 230.0 lb

## 2021-07-25 DIAGNOSIS — Z124 Encounter for screening for malignant neoplasm of cervix: Secondary | ICD-10-CM | POA: Diagnosis not present

## 2021-07-25 DIAGNOSIS — N950 Postmenopausal bleeding: Secondary | ICD-10-CM

## 2021-07-25 DIAGNOSIS — R9389 Abnormal findings on diagnostic imaging of other specified body structures: Secondary | ICD-10-CM | POA: Diagnosis not present

## 2021-07-25 DIAGNOSIS — Z01419 Encounter for gynecological examination (general) (routine) without abnormal findings: Secondary | ICD-10-CM

## 2021-07-25 NOTE — Telephone Encounter (Signed)
Please precert and schedule hysteroscopy, Myosure removal of endometrial mass, dilation and curettage at [facility].  ? ?Diagnosis is postmenopausal bleeding, endometrial mass.  ? ?Time needed 45 minutes.  ? ?No preop needed. ?

## 2021-07-25 NOTE — Patient Instructions (Signed)
Dilation and Curettage or Vacuum Curettage, Care After ?The following information offers guidance on how to care for yourself after your procedure. Your health care provider may also give you more specific instructions. If you have problems or questions, contact your health care provider. ?What can I expect after the procedure? ?After the procedure, it is common to have: ?Mild pain or cramping. ?Some vaginal bleeding or spotting. ?These may last for up to 2 weeks after your procedure. ?Follow these instructions at home: ?Medicines ?Take over-the-counter and prescription medicines only as told by your health care provider. This is especially important if you take blood-thinning medicine. ?Ask your health care provider if the medicine prescribed to you: ?Requires you to avoid driving or using machinery. ?Can cause constipation. You may need to take these actions to prevent or treat constipation: ?Drink enough fluid to keep your urine pale yellow. ?Take over-the-counter or prescription medicines. ?Eat foods that are high in fiber, such as beans, whole grains, and fresh fruits and vegetables. ?Limit foods that are high in fat and processed sugars, such as fried or sweet foods. ?Activity ? ?If you were given a sedative during the procedure, it can affect you for several hours. Do not drive or operate machinery until your health care provider says that it is safe. ?Rest as told by your health care provider. ?Avoid sitting for a long time without moving. Get up to take short walks every 1-2 hours. This is important to improve blood flow and breathing. Ask for help if you feel weak or unsteady. ?Do not lift anything that is heavier than 10 lb (4.5 kg), or the limit that you are told, until your health care provider says that it is safe. ?Return to your normal activities as told by your health care provider. Ask your health care provider what activities are safe for you. ?Lifestyle ?For at least 2 weeks, or as long as told  by your health care provider, do not: ?Douche. ?Use tampons. ?Have sex. ?General instructions ?Do not take baths, swim, or use a hot tub until your health care provider approves. Ask your health care provider if you may take showers. ?Do not use any products that contain nicotine or tobacco. These products include cigarettes, chewing tobacco, and vaping devices, such as e-cigarettes. These can delay healing. If you need help quitting, ask your health care provider. ?Wear compression stockings as told by your health care provider. These stockings help to prevent blood clots and reduce swelling in your legs. ?It is up to you to get the results of your procedure. Ask your health care provider, or the department that is doing the procedure, when your results will be ready. ?Keep all follow-up visits. This is important. ?Contact a health care provider if: ?You have severe cramps that get worse or do not get better with medicine. ?You have severe pain in the abdomen. ?You cannot drink fluids without vomiting. ?You develop pain in a different area of your abdomen, such as just above your thighs. ?You have a bad-smelling discharge from the vagina. ?You have a rash. ?Get help right away if: ?You have vaginal bleeding that soaks more than one sanitary pad in 1 hour, and this happens for 2 hours in a row, or you pass large clots from your vagina. ?You have a fever that is above 100.4?F (38?C). ?Your abdomen feels very tender or hard. ?You have chest pain. ?You have shortness of breath. ?You feel dizzy or light-headed, or you faint. ?You have pain in  your neck or shoulder area. ?These symptoms may represent a serious problem that is an emergency. Do not wait to see if the symptoms will go away. Get medical help right away. Call your local emergency services (911 in the [place] Do not drive yourself to the hospital. ?Summary ?After your procedure, it is common to have mild pain and bleeding or spotting that may last for up to 2  weeks. ?Rest as told. Avoid sitting for a long time without moving. Get up to take short walks every 1-2 hours. ?Do not lift anything that is heavier than 10 lb (4.5 kg), or the limit that you are told. ?Keep all follow-up visits. Know the symptoms for which you should get help right away. ?This information is not intended to replace advice given to you by your health care provider. Make sure you discuss any questions you have with your health care provider. ?Document Revised: 02/18/2020 Document Reviewed: 02/18/2020 ?Elsevier Patient Education ? [place]. ?Hysteroscopy ?Hysteroscopy is a procedure used to look inside a woman's womb (uterus). This may be done for various reasons, including: ?To look for tumors and other growths in the uterus. ?To evaluate abnormal bleeding, fibroid tumors, polyps, scar tissue, or uterine cancer. ?To determine why a woman is unable to get pregnant or has had repeated pregnancy losses. ?To locate an IUD (intrauterine device). ?To place a birth control device into the fallopian tubes. ?During this procedure, a thin, flexible tube with a small light and camera (hysteroscope) is used to examine the uterus. The camera sends images to a monitor in the room so that your health care provider can view the inside of your uterus. A hysteroscopy should be done right after a menstrual period. ?Tell a health care provider about: ?Any allergies you have. ?All medicines you are taking, including vitamins, herbs, eye drops, creams, and over-the-counter medicines. ?Any problems you or family members have had with anesthetic medicines. ?Any blood disorders you have. ?Any surgeries you have had. ?Any medical conditions you have. ?Whether you are pregnant or may be pregnant. ?Whether you have been diagnosed with an STI (sexually transmitted infection) or you think you have an STI. ?What are the risks? ?Generally, this is a safe procedure. However, problems may occur, including: ?Excessive  bleeding. ?Infection. ?Damage to the uterus or other structures or organs. ?Allergic reaction to medicines or fluids that are used in the procedure. ?What happens before the procedure? ?Staying hydrated ?Follow instructions from your health care provider about hydration, which may include: ?Up to 2 hours before the procedure - you may continue to drink clear liquids, such as water, clear fruit juice, black coffee, and plain tea. ?Eating and drinking restrictions ?Follow instructions from your health care provider about eating and drinking, which may include: ?8 hours before the procedure - stop eating solid foods and drink clear liquids only. ?2 hours before the procedure - stop drinking clear liquids. ?Medicines ?Ask your health care provider about: ?Changing or stopping your regular medicines. This is especially important if you are taking diabetes medicines or blood thinners. ?Taking medicines such as aspirin and ibuprofen. These medicines can thin your blood. Do not take these medicines unless your health care provider tells you to take them. ?Taking over-the-counter medicines, vitamins, herbs, and supplements. ?Medicine may be placed in your cervix the day before the procedure. This medicine causes the cervix to open (dilate). The larger opening makes it easier for the hysteroscope to be inserted into the uterus during the procedure. ?General  instructions ?Ask your health care provider: ?What steps will be taken to help prevent infection. These steps may include: ?Washing skin with a germ-killing soap. ?Taking antibiotic medicine. ?Do not use any products that contain nicotine or tobacco for at least 4 weeks before the procedure. These products include cigarettes, chewing tobacco, and vaping devices, such as e-cigarettes. If you need help quitting, ask your health care provider. ?Plan to have a responsible adult take you home from the hospital or clinic. ?Plan to have a responsible adult care for you for the  time you are told after you leave the hospital or clinic. This is important. ?Empty your bladder before the procedure begins. ?What happens during the procedure? ?An IV will be inserted into one of your veins.

## 2021-07-26 LAB — CYTOLOGY - PAP
Comment: NEGATIVE
Diagnosis: NEGATIVE
High risk HPV: NEGATIVE

## 2021-07-28 NOTE — Telephone Encounter (Signed)
Spoke with patient. Reviewed surgery dates. Patient request to proceed with surgery on 08/22/21.  Advised patient I will forward to business office for return call. I will return call once surgery date and time confirmed. Patient verbalizes understanding and is agreeable.   Surgery request sent.

## 2021-07-29 NOTE — Telephone Encounter (Signed)
Spoke with patient. Surgery date request confirmed.  Advised surgery is scheduled for 08/22/21, [facility] at 0730.  Surgery instruction sheet and hospital brochure reviewed, printed copy will be mailed.  Patient verbalizes understanding and is agreeable.   Encounter may be closed once benefits reviewed.

## 2021-07-31 NOTE — H&P (Signed)
Office Visit 07/25/2021 [facility], [place] Obstetrics and Gynecology Well woman exam with routine gynecological exam +3 more Dx Referred by Marrian Salvage, [place] Reason for Visit   Additional Documentation  Vitals:  BP 118/84 Pulse 69 Ht 5' 3.25" (1.607 m) Wt 104.3 kg LMP 04/14/2017 SpO2 93% BMI 40.42 kg/m BSA 2.16 m  More Vitals  Flowsheets:  Anthropometrics, NEWS, MEWS Score, Method of Visit   Encounter Info:  Billing Info, History, Allergies, Detailed Report    All Notes    Progress Notes by Nunzio Cobbs, MD at 07/25/2021 1:30 PM  Author: Nunzio Cobbs, MD Author Type: Physician Filed: 07/25/2021  2:22 PM  Note Status: Signed Cosign: Cosign Not Required Encounter Date: 07/25/2021  Editor: Nunzio Cobbs, MD (Physician)      Prior Versions: 1. Netta Corrigan, CMA (Certified Medical Assistant) at 07/25/2021  1:41 PM - Sign when Signing Visit    46 y.o. [id number] Married Caucasian female here for annual exam and discussion of procedure due to PMB.     Had postmenopausal bleeding for one week starting 06/17/21.  Spotting again yesterday.    Pelvic US 07/19/21: Uterus 6.97 x 4.82 x 4.84 cm.  Heterogeneous myometrium.  Fibroids 1.52 cm, 0.92 cm, 0.72 cm.  EMS 20.02 mm. Left ovary 2.59 x 1.54 x 1.26 cm.  Atrophic.  Right ovary 3.38 x 2.17 x 1.28 cm.  [id number] cm follicle. No adnexal masses.  No free fluid.    EMB 07/19/21: Benign endometrium.      PCP: Jodi Mourning, NP    Patient's last menstrual period was 04/14/2017.     Sexually active: Yes.    The current method of family planning is post menopausal status.    Exercising: Yes.     3x week Smoker:  no   Health Maintenance: Pap: 09/22/19-WNL, HPV- neg, 10/25/15-WNL, HPV- neg, 11/25/13-WNL, HPV- neg, 08/21/12-WNL History of abnormal Pap:  no MMG: 04/12/21-neg birads 1 Colonoscopy: never.  PCP is ordering Cologuard.  BMD:  n/a   Result  n/a TDaP: 08/25/13 Gardasil:   no HIV: never Hep C: 09/22/19- NR Screening Labs:  PCP.    reports that she has never smoked. She has never used smokeless tobacco. She reports current alcohol use. She reports that she does not use drugs.       Past Medical History:  Diagnosis Date   Asthma     HSV-1 infection     Hypertension             Past Surgical History:  Procedure Laterality Date   INTRAUTERINE DEVICE INSERTION   07/12/2016    Mirena   NASAL SEPTUM SURGERY                Current Outpatient Medications  Medication Sig Dispense Refill   albuterol (PROVENTIL) (2.5 MG/3ML) 0.083% nebulizer solution Take 3 mLs (2.5 mg total) by nebulization every 6 (six) hours as needed for wheezing or shortness of breath. 150 mL 1   albuterol (VENTOLIN HFA) 108 (90 Base) MCG/ACT inhaler Inhale 2 puffs into the lungs every 6 (six) hours as needed for wheezing or shortness of breath. 18 g 3   budesonide-formoterol (SYMBICORT) 160-4.5 MCG/ACT inhaler Inhale 2 puffs into the lungs 2 (two) times daily. 3 each 3   cetirizine (ZYRTEC) 10 MG tablet Take 1 tablet by mouth daily.       chlorthalidone (HYGROTON) 25 MG tablet  TAKE 1 TABLET(25 MG) BY MOUTH DAILY 90 tablet 3   fluticasone (FLONASE) 50 MCG/ACT nasal spray 2 spray by Each Nare route daily. 16 g 3   montelukast (SINGULAIR) 10 MG tablet TAKE 1 TABLET(10 MG) BY MOUTH AT BEDTIME 90 tablet 3   sertraline (ZOLOFT) 50 MG tablet Take 1 tablet (50 mg total) by mouth daily. 90 tablet 3   valACYclovir (VALTREX) 1000 MG tablet Take 1 tablet (1,000 mg total) by mouth 3 (three) times daily. As directed 30 tablet 0    No current facility-administered medications for this visit.           Family History  Problem Relation Age of Onset   Stroke Mother     Hypertension Mother     Diabetes Father     Hypertension Father     Aortic dissection Father     Thyroid disease Unknown     Thyroid cancer Unknown          Paternal cousin   Aortic  aneurysm Unknown     Hashimoto's thyroiditis Sister     Stroke Maternal Grandmother     Stroke Maternal Grandfather     Stroke Paternal Grandmother     Dementia Paternal Grandfather     Diabetes Paternal Aunt     Stroke Maternal Uncle     Ehlers-Danlos syndrome Son        Review of Systems  Genitourinary:  Positive for vaginal bleeding.    Exam:   BP 118/84   Pulse 69   Ht 5' 3.25" (1.607 m)   Wt 230 lb (104.3 kg)   LMP 04/14/2017   SpO2 93%   BMI 40.42 kg/m     General appearance: alert, cooperative and appears stated age Head: normocephalic, without obvious abnormality, atraumatic Neck: no adenopathy, supple, symmetrical, trachea midline and thyroid normal to inspection and palpation Lungs: clear to auscultation bilaterally Breasts: normal appearance, no masses or tenderness, No nipple retraction or dimpling, No nipple discharge or bleeding, No axillary adenopathy Heart: regular rate and rhythm Abdomen: soft, non-tender; no masses, no organomegaly Extremities: extremities normal, atraumatic, no cyanosis or edema Skin: skin color, texture, turgor normal. No rashes or lesions Lymph nodes: cervical, supraclavicular, and axillary nodes normal. Neurologic: grossly normal   Pelvic: External genitalia:  no lesions              No abnormal inguinal nodes palpated.              Urethra:  normal appearing urethra with no masses, tenderness or lesions              Bartholins and Skenes: normal                 Vagina: normal appearing vagina with normal color and discharge, no lesions              Cervix: no lesions.  Yellow drainage from the cervix.               Pap taken: yes Bimanual Exam:  Uterus:  normal size, contour, position, consistency, mobility, non-tender              Adnexa: no mass, fullness, tenderness              Rectal exam: Yes.  .  Confirms.              Anus:  normal sphincter tone, no lesions   Chaperone was present for exam:  Lovena Le, CMA  Assessment:     Well woman visit with gynecologic exam. Postmenopausal bleeding.   Thickened endometrium.  Negative endometrial biopsy which does not correlate with the biopsy.  Arcuate uterus.    Plan: Mammogram screening discussed. Self breast awareness reviewed. Pap and HR HPV collected. Guidelines for Calcium, Vitamin D, regular exercise program including cardiovascular and weight bearing exercise.   Follow up annually and prn.    In addition to the annual exam, today, consultation performed regarding postmenopausal bleeding, thickened endometrium, and endometrial biopsy, 20 minutes duration.  I am recommending hysteroscopy with Myosure polypectomy, dilation and curettage.  Risks, benefits, and alternatives reviewed. Risks include but are not limited to bleeding, infection, damage to surrounding organs including uterine perforation requiring hospitalization and laparoscopy, reaction to anesthesia, pneumonia, DVT, PE, death, need for further treatment. Surgical expectations and recovery discussed.  Patient wishes to proceed.                      Patient Instructions by Nunzio Cobbs, MD at 07/25/2021 1:30 PM  Author: Nunzio Cobbs, MD Author Type: Physician Filed: 07/25/2021  2:02 PM  Note Status: Signed Cosign: Cosign Not Required Encounter Date: 07/25/2021  Editor: Nunzio Cobbs, MD (Physician)       Addendum:  pap negative for SIL and HR HPV negative 07/25/21.

## 2021-08-01 NOTE — Telephone Encounter (Signed)
Spoke with patient regarding surgery benefits. Patient acknowledges understanding of information presented. Patient is aware that benefits presented are professional benefits only. Patient is aware the hospital will call with facility benefits. See account note.  Staff message sent to [facility]

## 2021-08-12 ENCOUNTER — Other Ambulatory Visit: Payer: Self-pay

## 2021-08-12 ENCOUNTER — Encounter (HOSPITAL_BASED_OUTPATIENT_CLINIC_OR_DEPARTMENT_OTHER): Payer: Self-pay | Admitting: Obstetrics and Gynecology

## 2021-08-12 NOTE — Progress Notes (Signed)
Pt is a jehovah's witness and refuses blood transfusions and will bring POA day of surgery.    Told to bring her inhalers to day of surgery.

## 2021-08-18 ENCOUNTER — Encounter (HOSPITAL_COMMUNITY)
Admission: RE | Admit: 2021-08-18 | Discharge: 2021-08-18 | Disposition: A | Discharge status: Home / Self Care | Payer: 59 | Source: Ambulatory Visit | Attending: Obstetrics and Gynecology | Admitting: Obstetrics and Gynecology

## 2021-08-18 ENCOUNTER — Telehealth: Payer: Self-pay | Admitting: *Deleted

## 2021-08-18 ENCOUNTER — Other Ambulatory Visit: Payer: Self-pay | Admitting: *Deleted

## 2021-08-18 DIAGNOSIS — Z01818 Encounter for other preprocedural examination: Secondary | ICD-10-CM | POA: Insufficient documentation

## 2021-08-18 DIAGNOSIS — R9389 Abnormal findings on diagnostic imaging of other specified body structures: Secondary | ICD-10-CM | POA: Diagnosis not present

## 2021-08-18 DIAGNOSIS — N950 Postmenopausal bleeding: Secondary | ICD-10-CM

## 2021-08-18 DIAGNOSIS — I10 Essential (primary) hypertension: Secondary | ICD-10-CM | POA: Insufficient documentation

## 2021-08-18 LAB — BASIC METABOLIC PANEL
Anion gap: 9 (ref 5–15)
BUN: 11 mg/dL (ref 6–20)
CO2: 29 mmol/L (ref 22–32)
Calcium: 9.7 mg/dL (ref 8.9–10.3)
Chloride: 102 mmol/L (ref 98–111)
Creatinine, Ser: 0.81 mg/dL (ref 0.44–1.00)
GFR, Estimated: >60 mL/min (ref >60)
Glucose, Bld: 96 mg/dL (ref 70–99)
Potassium: 3 mmol/L — ABNORMAL LOW (ref 3.5–5.1)
Sodium: 140 mmol/L (ref 135–145)

## 2021-08-18 LAB — CBC
HCT: 42.9 % (ref 36.0–46.0)
Hemoglobin: 14.6 g/dL (ref 12.0–15.0)
MCH: 31.9 pg (ref 26.0–34.0)
MCHC: 34 g/dL (ref 30.0–36.0)
MCV: 93.9 fL (ref 80.0–100.0)
Platelets: 353 10*3/uL (ref 150–400)
RBC: 4.57 MIL/uL (ref 3.87–5.11)
RDW: 13.2 % (ref 11.5–15.5)
WBC: 5.9 10*3/uL (ref 4.0–10.5)
nRBC: 0 % (ref 0.0–0.2)

## 2021-08-18 NOTE — Telephone Encounter (Signed)
-----   Message from Nunzio Cobbs, MD sent at 08/18/2021  2:15 PM EDT ----- Please contact patient with results of preop blood work showing low potassium level of 3.0.   Please send in a prescription for potassium chloride 20 MEq tablet daily.  Disp:  15 RF:  none.  Have her start taking the medication today.   Her blood counts are normal.

## 2021-08-18 NOTE — Telephone Encounter (Signed)
Patient informed with below. Dr.Silva please see pending Rx. I just want to make sure correct Rx is sent, they had 2 different potassium chloride 20 meq in epic. Thanks!

## 2021-08-19 MED ORDER — POTASSIUM CHLORIDE CRYS ER 20 MEQ PO TBCR: 20.0000 meq | EXTENDED_RELEASE_TABLET | Freq: Every day | ORAL | 0 refills | Status: DC

## 2021-08-19 NOTE — Telephone Encounter (Signed)
Encounter reviewed and closed.  

## 2021-08-19 NOTE — Telephone Encounter (Signed)
Dr.Silva called the office this am ( she is out of the office today) and told me which Rx for potassium chloride to send in for patient. I called patient and spoke with her informing her that Rx sent to pharmacy. Dr.Silva said patient can also drink orange juice and bananas as well. Patient she would do this as well.

## 2021-08-22 ENCOUNTER — Ambulatory Visit (HOSPITAL_BASED_OUTPATIENT_CLINIC_OR_DEPARTMENT_OTHER): Payer: 59 | Admitting: Anesthesiology

## 2021-08-22 ENCOUNTER — Other Ambulatory Visit: Payer: Self-pay

## 2021-08-22 ENCOUNTER — Encounter (HOSPITAL_BASED_OUTPATIENT_CLINIC_OR_DEPARTMENT_OTHER)
Admission: RE | Disposition: A | Discharge status: Home / Self Care | Payer: Self-pay | Source: Home / Self Care | Attending: Obstetrics and Gynecology

## 2021-08-22 ENCOUNTER — Encounter (HOSPITAL_BASED_OUTPATIENT_CLINIC_OR_DEPARTMENT_OTHER): Payer: Self-pay | Admitting: Obstetrics and Gynecology

## 2021-08-22 ENCOUNTER — Ambulatory Visit (HOSPITAL_BASED_OUTPATIENT_CLINIC_OR_DEPARTMENT_OTHER)
Admission: RE | Admit: 2021-08-22 | Discharge: 2021-08-22 | Disposition: A | Discharge status: Home / Self Care | Payer: 59 | Attending: Obstetrics and Gynecology | Admitting: Obstetrics and Gynecology

## 2021-08-22 DIAGNOSIS — R9389 Abnormal findings on diagnostic imaging of other specified body structures: Secondary | ICD-10-CM

## 2021-08-22 DIAGNOSIS — N840 Polyp of corpus uteri: Secondary | ICD-10-CM

## 2021-08-22 DIAGNOSIS — J45909 Unspecified asthma, uncomplicated: Secondary | ICD-10-CM | POA: Diagnosis not present

## 2021-08-22 DIAGNOSIS — Z6841 Body Mass Index (BMI) 40.0 and over, adult: Secondary | ICD-10-CM | POA: Diagnosis not present

## 2021-08-22 DIAGNOSIS — N950 Postmenopausal bleeding: Secondary | ICD-10-CM | POA: Diagnosis not present

## 2021-08-22 DIAGNOSIS — Z531 Procedure and treatment not carried out because of patient's decision for reasons of belief and group pressure: Secondary | ICD-10-CM

## 2021-08-22 DIAGNOSIS — IMO0001 Reserved for inherently not codable concepts without codable children: Secondary | ICD-10-CM

## 2021-08-22 DIAGNOSIS — I10 Essential (primary) hypertension: Secondary | ICD-10-CM | POA: Diagnosis not present

## 2021-08-22 HISTORY — DX: Reserved for inherently not codable concepts without codable children: IMO0001

## 2021-08-22 HISTORY — PX: DILATATION & CURETTAGE/HYSTEROSCOPY WITH MYOSURE: SHX6511

## 2021-08-22 HISTORY — DX: Ehlers-Danlos syndrome, unspecified: Q79.60

## 2021-08-22 HISTORY — DX: Anxiety disorder, unspecified: F41.9

## 2021-08-22 HISTORY — DX: Procedure and treatment not carried out because of patient's decision for reasons of belief and group pressure: Z53.1

## 2021-08-22 HISTORY — DX: Presence of spectacles and contact lenses: Z97.3

## 2021-08-22 HISTORY — DX: COVID-19: U07.1

## 2021-08-22 HISTORY — DX: Attention-deficit hyperactivity disorder, unspecified type: F90.9

## 2021-08-22 HISTORY — DX: Depression, unspecified: F32.A

## 2021-08-22 LAB — NO BLOOD PRODUCTS

## 2021-08-22 SURGERY — DILATATION & CURETTAGE/HYSTEROSCOPY WITH MYOSURE
Anesthesia: General

## 2021-08-22 MED ORDER — DEXAMETHASONE SODIUM PHOSPHATE 10 MG/ML IJ SOLN
INTRAMUSCULAR | Status: AC
  Filled 2021-08-22: qty 1

## 2021-08-22 MED ORDER — ACETAMINOPHEN 500 MG PO TABS
1000.0000 mg | ORAL_TABLET | ORAL | Status: AC
  Administered 2021-08-22: 1000 mg via ORAL

## 2021-08-22 MED ORDER — MIDAZOLAM HCL 2 MG/2ML IJ SOLN
INTRAMUSCULAR | Status: AC
  Filled 2021-08-22: qty 2

## 2021-08-22 MED ORDER — LIDOCAINE HCL 1 % IJ SOLN
INTRAMUSCULAR | Status: DC | PRN
  Administered 2021-08-22: 10 mL

## 2021-08-22 MED ORDER — IBUPROFEN 800 MG PO TABS: 800.0000 mg | ORAL_TABLET | Freq: Three times a day (TID) | ORAL | 0 refills | Status: AC | PRN

## 2021-08-22 MED ORDER — FENTANYL CITRATE (PF) 100 MCG/2ML IJ SOLN
INTRAMUSCULAR | Status: DC | PRN
Start: 2021-08-22 — End: 2021-08-22
  Administered 2021-08-22 (×2): 50 ug via INTRAVENOUS

## 2021-08-22 MED ORDER — ACETAMINOPHEN 500 MG PO TABS
ORAL_TABLET | ORAL | Status: AC
  Filled 2021-08-22: qty 2

## 2021-08-22 MED ORDER — SODIUM CHLORIDE 0.9 % IR SOLN
Status: DC | PRN
  Administered 2021-08-22: 3000 mL

## 2021-08-22 MED ORDER — KETOROLAC TROMETHAMINE 30 MG/ML IJ SOLN
INTRAMUSCULAR | Status: DC | PRN
  Administered 2021-08-22: 30 mg via INTRAVENOUS

## 2021-08-22 MED ORDER — LIDOCAINE HCL (CARDIAC) PF 100 MG/5ML IV SOSY
PREFILLED_SYRINGE | INTRAVENOUS | Status: DC | PRN
  Administered 2021-08-22: 100 mg via INTRAVENOUS

## 2021-08-22 MED ORDER — LACTATED RINGERS IV SOLN: INTRAVENOUS | Status: DC

## 2021-08-22 MED ORDER — ONDANSETRON HCL 4 MG/2ML IJ SOLN
INTRAMUSCULAR | Status: DC | PRN
  Administered 2021-08-22: 4 mg via INTRAVENOUS

## 2021-08-22 MED ORDER — DEXAMETHASONE SODIUM PHOSPHATE 4 MG/ML IJ SOLN
INTRAMUSCULAR | Status: DC | PRN
  Administered 2021-08-22: 10 mg via INTRAVENOUS

## 2021-08-22 MED ORDER — ONDANSETRON HCL 4 MG/2ML IJ SOLN
INTRAMUSCULAR | Status: AC
  Filled 2021-08-22: qty 2

## 2021-08-22 MED ORDER — MIDAZOLAM HCL 5 MG/5ML IJ SOLN
INTRAMUSCULAR | Status: DC | PRN
  Administered 2021-08-22: 2 mg via INTRAVENOUS

## 2021-08-22 MED ORDER — OXYCODONE HCL 5 MG/5ML PO SOLN: 5.0000 mg | Freq: Once | ORAL | Status: DC | PRN

## 2021-08-22 MED ORDER — PROPOFOL 10 MG/ML IV BOLUS
INTRAVENOUS | Status: AC
  Filled 2021-08-22: qty 20

## 2021-08-22 MED ORDER — KETOROLAC TROMETHAMINE 30 MG/ML IJ SOLN
30.0000 mg | Freq: Once | INTRAMUSCULAR | Status: DC | PRN
Start: 2021-08-22 — End: 2021-08-22

## 2021-08-22 MED ORDER — POVIDONE-IODINE 10 % EX SWAB: 2.0000 "application " | Freq: Once | CUTANEOUS | Status: DC

## 2021-08-22 MED ORDER — FENTANYL CITRATE (PF) 100 MCG/2ML IJ SOLN: 25.0000 ug | INTRAMUSCULAR | Status: DC | PRN

## 2021-08-22 MED ORDER — KETOROLAC TROMETHAMINE 30 MG/ML IJ SOLN
INTRAMUSCULAR | Status: AC
  Filled 2021-08-22: qty 1

## 2021-08-22 MED ORDER — FENTANYL CITRATE (PF) 100 MCG/2ML IJ SOLN
INTRAMUSCULAR | Status: AC
  Filled 2021-08-22: qty 2

## 2021-08-22 MED ORDER — ONDANSETRON HCL 4 MG/2ML IJ SOLN: 4.0000 mg | Freq: Once | INTRAMUSCULAR | Status: DC | PRN

## 2021-08-22 MED ORDER — PROPOFOL 10 MG/ML IV BOLUS
INTRAVENOUS | Status: DC | PRN
  Administered 2021-08-22: 200 mg via INTRAVENOUS

## 2021-08-22 MED ORDER — OXYCODONE HCL 5 MG PO TABS: 5.0000 mg | ORAL_TABLET | Freq: Once | ORAL | Status: DC | PRN

## 2021-08-22 MED ORDER — LIDOCAINE HCL (PF) 2 % IJ SOLN
INTRAMUSCULAR | Status: AC
  Filled 2021-08-22: qty 5

## 2021-08-22 SURGICAL SUPPLY — 19 items
CATH ROBINSON RED A/P 16FR (CATHETERS) ×3 IMPLANT
DEVICE MYOSURE LITE (MISCELLANEOUS) IMPLANT
DEVICE MYOSURE REACH (MISCELLANEOUS) ×1 IMPLANT
DILATOR CANAL MILEX (MISCELLANEOUS) IMPLANT
DRSG TELFA 3X8 NADH (GAUZE/BANDAGES/DRESSINGS) IMPLANT
GAUZE 4X4 16PLY ~~LOC~~+RFID DBL (SPONGE) ×4 IMPLANT
GLOVE BIO SURGEON STRL SZ 6.5 (GLOVE) ×3 IMPLANT
GOWN STRL REUS W/TWL LRG LVL3 (GOWN DISPOSABLE) ×3 IMPLANT
IV NS IRRIG 3000ML ARTHROMATIC (IV SOLUTION) ×3 IMPLANT
KIT PROCEDURE FLUENT (KITS) ×3 IMPLANT
KIT TURNOVER CYSTO (KITS) ×3 IMPLANT
MYOSURE XL FIBROID (MISCELLANEOUS)
PACK VAGINAL MINOR WOMEN LF (CUSTOM PROCEDURE TRAY) ×3 IMPLANT
PAD DRESSING TELFA 3X8 NADH (GAUZE/BANDAGES/DRESSINGS) ×2 IMPLANT
PAD OB MATERNITY 4.3X12.25 (PERSONAL CARE ITEMS) ×3 IMPLANT
SEAL CERVICAL OMNI LOK (ABLATOR) IMPLANT
SEAL ROD LENS SCOPE MYOSURE (ABLATOR) ×3 IMPLANT
SYSTEM TISS REMOVAL MYOSURE XL (MISCELLANEOUS) IMPLANT
TOWEL OR 17X26 10 PK STRL BLUE (TOWEL DISPOSABLE) ×3 IMPLANT

## 2021-08-22 NOTE — Anesthesia Preprocedure Evaluation (Signed)
Anesthesia Evaluation  Patient identified by MRN, date of birth, ID band Patient awake    Reviewed: Allergy & Precautions, NPO status , Patient's Chart, lab work & pertinent test results  Airway Mallampati: II  TM Distance: >3 FB Neck ROM: Full    Dental no notable dental hx.    Pulmonary asthma ,    Pulmonary exam normal breath sounds clear to auscultation       Cardiovascular hypertension, Normal cardiovascular exam Rhythm:Regular Rate:Normal     Neuro/Psych negative neurological ROS  negative psych ROS   GI/Hepatic negative GI ROS, Neg liver ROS,   Endo/Other  Morbid obesity  Renal/GU negative Renal ROS  negative genitourinary   Musculoskeletal negative musculoskeletal ROS (+)   Abdominal   Peds negative pediatric ROS (+)  Hematology negative hematology ROS (+)   Anesthesia Other Findings   Reproductive/Obstetrics negative OB ROS                             Anesthesia Physical Anesthesia Plan  ASA: 2  Anesthesia Plan: General   Post-op Pain Management:    Induction: Intravenous  PONV Risk Score and Plan: 3 and Ondansetron, Dexamethasone, Treatment may vary due to age or medical condition and Midazolam  Airway Management Planned: LMA  Additional Equipment:   Intra-op Plan:   Post-operative Plan: Extubation in OR  Informed Consent: I have reviewed the patients History and Physical, chart, labs and discussed the procedure including the risks, benefits and alternatives for the proposed anesthesia with the patient or authorized representative who has indicated his/her understanding and acceptance.     Dental advisory given  Plan Discussed with: CRNA and Surgeon  Anesthesia Plan Comments:         Anesthesia Quick Evaluation

## 2021-08-22 NOTE — Addendum Note (Signed)
Addendum  created 08/22/21 1349 by Jessica Priest, CRNA   Charge Capture section accepted

## 2021-08-22 NOTE — Anesthesia Postprocedure Evaluation (Signed)
Anesthesia Post Note  Patient: Katelyn Wilcox  Procedure(s) Performed: [place]     Patient location during evaluation: PACU Anesthesia Type: General Level of consciousness: awake and alert Pain management: pain level controlled Vital Signs Assessment: post-procedure vital signs reviewed and stable Respiratory status: spontaneous breathing, nonlabored ventilation, respiratory function stable and patient connected to nasal cannula oxygen Cardiovascular status: blood pressure returned to baseline and stable Postop Assessment: no apparent nausea or vomiting Anesthetic complications: no   No notable events documented.  Last Vitals:  Vitals:   08/22/21 0825 08/22/21 0830  BP: (!) 137/93 (!) 144/94  Pulse: 60 (!) 54  Resp: 16 15  Temp: 36.5 C   SpO2: 99% 96%    Last Pain:  Vitals:   08/22/21 0557  TempSrc: Oral  PainSc: 0-No pain                 [name]

## 2021-08-22 NOTE — Progress Notes (Addendum)
Update to History and Physical  Preop potassium 3.0. Patient received Rx for Kcl 20 Meq daily, starting 08/19/21. Patient examined.  OK to proceed with surgery.   Addendum - Patient indicates that she is a Jehovah's witness and declines transfusion of blood products.   She accepts albumin.  She states that her power of attorney [facility] intake [facility].

## 2021-08-22 NOTE — Transfer of Care (Signed)
Immediate Anesthesia Transfer of Care Note  Patient: Katelyn Wilcox  Procedure(s) Performed: Procedure(s) (LRB): DILATATION & CURETTAGE/HYSTEROSCOPY WITH MYOSURE (N/A)  Patient Location: PACU  Anesthesia Type: General  Level of Consciousness: awake, oriented, sedated and patient cooperative  Airway & Oxygen Therapy: Patient Spontanous Breathing and Patient connected to face mask oxygen  Post-op Assessment: Report given to PACU RN and Post -op Vital signs reviewed and stable  Post vital signs: Reviewed and stable  Complications: No apparent anesthesia complications  Last Vitals:  Vitals Value Taken Time  BP    Temp    Pulse 57 08/22/21 0826  Resp 16 08/22/21 0826  SpO2 96 % 08/22/21 0826  Vitals shown include unvalidated device data.  Last Pain:  Vitals:   08/22/21 0557  TempSrc: Oral  PainSc: 0-No pain      Patients Stated Pain Goal: 5 ([id number])  Complications: No notable events documented.

## 2021-08-22 NOTE — Anesthesia Procedure Notes (Signed)
Procedure Name: LMA Insertion Date/Time: 08/22/2021 7:38 AM  Performed by: Justice Rocher, CRNAPre-anesthesia Checklist: Patient identified, Emergency Drugs available, Suction available, Patient being monitored and Timeout performed Patient Re-evaluated:Patient Re-evaluated prior to induction Oxygen Delivery Method: Circle system utilized Preoxygenation: Pre-oxygenation with 100% oxygen Induction Type: IV induction Ventilation: Mask ventilation without difficulty LMA: LMA inserted LMA Size: 4.0 Number of attempts: 1 Airway Equipment and Method: Bite block Placement Confirmation: positive ETCO2, breath sounds checked- equal and bilateral and CO2 detector Tube secured with: Tape Dental Injury: Teeth and Oropharynx as per pre-operative assessment

## 2021-08-22 NOTE — Discharge Instructions (Addendum)
Hi Katelyn Wilcox,   I found and removed a polyp in the uterus.  Everything went well!  Conley Simmonds, MD DISCHARGE INSTRUCTIONS: HYSTEROSCOPY / ENDOMETRIAL ABLATION The following instructions have been prepared to help you care for yourself upon your return home.  May take Ibuprofen after 4 PM.  May take stool softner while taking narcotic pain medication to prevent constipation.  Drink plenty of water.  Personal hygiene:  Use sanitary pads for vaginal drainage, not tampons.  Shower the day after your procedure.  NO tub baths, pools or Jacuzzis for 2-3 weeks.  Wipe front to back after using the bathroom.  Activity and limitations:  Do NOT drive or operate any equipment for 24 hours. The effects of anesthesia are still present and drowsiness may result.  Do NOT rest in bed all day.  Walking is encouraged.  Walk up and down stairs slowly.  You may resume your normal activity in one to two days or as indicated by your physician. Sexual activity: NO intercourse for at least 2 weeks after the procedure, or as indicated by your Doctor.  Diet: Eat a light meal as desired this evening. You may resume your usual diet tomorrow.  Return to Work: You may resume your work activities in one to two days or as indicated by Therapist, sports.  What to expect after your surgery: Expect to have vaginal bleeding/discharge for 2-3 days and spotting for up to 10 days. It is not unusual to have soreness for up to 1-2 weeks. You may have a slight burning sensation when you urinate for the first day. Mild cramps may continue for a couple of days. You may have a regular period in 2-6 weeks.  Call your doctor for any of the following:  Excessive vaginal bleeding or clotting, saturating and changing one pad every hour.  Inability to urinate 6 hours after discharge from hospital.  Pain not relieved by pain medication.  Fever of 100.4 F or greater.  Unusual vaginal discharge or odor.   Post Anesthesia Home Care  Instructions  Activity: Get plenty of rest for the remainder of the day. A responsible individual must stay with you for 24 hours following the procedure.  For the next 24 hours, DO NOT: -Drive a car -Advertising copywriter -Drink alcoholic beverages -Take any medication unless instructed by your physician -Make any legal decisions or sign important papers.  Meals: Start with liquid foods such as gelatin or soup. Progress to regular foods as tolerated. Avoid greasy, spicy, heavy foods. If nausea and/or vomiting occur, drink only clear liquids until the nausea and/or vomiting subsides. Call your physician if vomiting continues.  Special Instructions/Symptoms: Your throat may feel dry or sore from the anesthesia or the breathing tube placed in your throat during surgery. If this causes discomfort, gargle with warm salt water. The discomfort should disappear within 24 hours.  May take Tylenol as needed for cramping/discomfort beginning at 51 NOON.

## 2021-08-22 NOTE — Op Note (Signed)
OPERATIVE REPORT   PREOPERATIVE DIAGNOSES:   Postmenopausal bleeding, endometrial thickening  POSTOPERATIVE DIAGNOSES:   Postmenopausal bleeding, endometrial polyp  PROCEDURE:  Hysteroscopy with dilation and curettage and Myosure resection of endometrial polyp  SURGEON:  Randye Lobo, MD  ANESTHESIA:  LMA, paracervical block with 10 mL of 1% lidocaine.  IV FLUIDS:  500 cc LR  EBL:  5 cc  URINE OUTPUT:   150 cc by I and O catheterization  NORMAL SALINE DEFICIT:   200 cc  COMPLICATIONS:  None.  INDICATIONS FOR THE PROCEDURE:     The patient is a 46 year old [id number] Caucasian female who presents with recurrent postmenopausal bleeding.  Pelvic ultrasound showed heterogeneous myometrium and 3 small fibroids.  Her endometrium measured 20.01 mm.  An endometrial biopsy was benign and did not explain the endometrial thickening.  A plan is now made to proceed with a hysteroscopy with dilation and curettage and Myosurgical resection of endometrial thickening; after risks, benefits and alternatives were reviewed.  FINDINGS:  Exam under anesthesia revealed a small anteverted, mobile uterus.  No adnexal masses were noted.  The uterus was sounded to 7 cm. Hysteroscopy showed a 1 cm posterior fundal polyp.   The tubal ostia regions were normal.   Endometrial currettings were scant.  SPECIMENS:   endometrial polyp and endometrial curettage specimen.  PROCEDURE IN DETAIL:  The patient was reidentified in the preoperative hold area.  She received TED hose and PAS stockings for DVT prophylaxis.  In the operating room, the patient was placed in the dorsal lithotomy position and then an LMA anesthetic was introduced.  The patient's lower abdomen, vagina and perineum were sterilely prepped with Betadine and the  patient's bladder was catheterized of urine.  She was sterilly draped.  An exam under anesthesia was performed.  A speculum was placed inside the vagina and a single-tooth tenaculum  was placed on the anterior cervical lip.  A paracervical block was performed with a total of 10 mL of 1% lidocaine plain.  The uterus was sounded. The cervix was dilated to a #21 Pratt dilator.  The MyoSure hysteroscope was then inserted inside the uterine cavity under the continuous infusion of normal saline solution.   Findings are as noted above.  The Myosure Reach device was used to resect the endometrial polyp without difficulty.  The polyp was sent to Pathology.  The MyoSure hysteroscope was removed after the polyp was resected.  The cervix was further dilated to a #23 Pratt dilator.   The serrated and then sharp curettes were introduced into the uterine cavity and the endometrium was curetted in all 4 quadrants.   A minimal amount of endometrial curettings was obtained.  This specimen was sent to Pathology.  The single-tooth tenaculum which had been placed on the anterior cervical lip was removed.     Hemostasis was good, and all of the vaginal instruments were removed.  The patient was awakened and escorted to the recovery room in stable condition after she was cleansed of Betadine.  There were no complications to the procedure.   All needle, instrument and sponge counts were correct.  Randye Lobo, MD

## 2021-08-23 ENCOUNTER — Encounter (HOSPITAL_BASED_OUTPATIENT_CLINIC_OR_DEPARTMENT_OTHER): Payer: Self-pay | Admitting: Obstetrics and Gynecology

## 2021-08-23 LAB — SURGICAL PATHOLOGY

## 2021-08-26 ENCOUNTER — Other Ambulatory Visit: Payer: Self-pay | Admitting: Obstetrics and Gynecology

## 2021-09-05 ENCOUNTER — Encounter: Payer: Self-pay | Admitting: Obstetrics and Gynecology

## 2021-09-05 DIAGNOSIS — Z0289 Encounter for other administrative examinations: Secondary | ICD-10-CM

## 2021-09-05 NOTE — Progress Notes (Deleted)
GYNECOLOGY  VISIT   HPI: 46 y.o.   Married  Caucasian  female   G3P0003 with Patient's last menstrual period was 04/14/2017.   here for 1 week status post  DILATATION & CURETTAGE/HYSTEROSCOPY WITH MYOSURE.  GYNECOLOGIC HISTORY: Patient's last menstrual period was 04/14/2017. Contraception:  PMP Menopausal hormone therapy:  ***None Last mammogram:  04/12/21-neg birads 1 Last pap smear: 07-25-21 Neg:Neg HR HPV, 09/22/19-WNL, HPV- neg, 10/25/15-WNL, HPV- neg        OB History     Gravida  3   Para      Term      Preterm      AB  0   Living  3      SAB      IAB      Ectopic  0   Multiple      Live Births                 Patient Active Problem List   Diagnosis Date Noted   Acute bacterial sinusitis 01/21/2014   Obesity (BMI 30-39.9) 08/25/2013   HTN (hypertension) 08/21/2012   Depression 12/01/2009   URI 12/01/2009   Asthma in adult 02/27/2008    Past Medical History:  Diagnosis Date   ADHD (attention deficit hyperactivity disorder)    doesnt take any meds for it   Anxiety    Asthma    COVID    May 2021 tired lasted a week. 08/12/2021   Depression    on Zoloft   Ehlers-Danlos syndrome    mixed connective disorder.  on occas. has joint pain and takes aleve   HSV-1 infection    Hypertension    Refusal of blood transfusions as patient is Jehovah's Witness 08/22/2021   Wears glasses    for reading only    Past Surgical History:  Procedure Laterality Date   DILATATION & CURETTAGE/HYSTEROSCOPY WITH MYOSURE N/A 08/22/2021   Procedure: DILATATION & CURETTAGE/HYSTEROSCOPY WITH MYOSURE;  Surgeon: Patton Salles, MD;  Location: [facility];  Service: Gynecology;  Laterality: N/A;   INTRAUTERINE DEVICE INSERTION  07/12/2016   Mirena   NASAL SEPTUM SURGERY      Current Outpatient Medications  Medication Sig Dispense Refill   albuterol (PROVENTIL) (2.5 MG/3ML) 0.083% nebulizer solution Take 3 mLs (2.5 mg total) by nebulization  every 6 (six) hours as needed for wheezing or shortness of breath. 150 mL 1   albuterol (VENTOLIN HFA) 108 (90 Base) MCG/ACT inhaler Inhale 2 puffs into the lungs every 6 (six) hours as needed for wheezing or shortness of breath. 18 g 3   budesonide-formoterol (SYMBICORT) 160-4.5 MCG/ACT inhaler Inhale 2 puffs into the lungs 2 (two) times daily. 3 each 3   cetirizine (ZYRTEC) 10 MG tablet Take 1 tablet by mouth daily.     chlorthalidone (HYGROTON) 25 MG tablet TAKE 1 TABLET(25 MG) BY MOUTH DAILY (Patient taking differently: Take 25 mg by mouth daily. TAKE 1 TABLET(25 MG) BY MOUTH DAILY) 90 tablet 3   fluticasone (FLONASE) 50 MCG/ACT nasal spray 2 spray by Each Nare route daily. 16 g 3   ibuprofen (ADVIL) 800 MG tablet Take 1 tablet (800 mg total) by mouth every 8 (eight) hours as needed. 30 tablet 0   montelukast (SINGULAIR) 10 MG tablet TAKE 1 TABLET(10 MG) BY MOUTH AT BEDTIME (Patient taking differently: Take 10 mg by mouth at bedtime. TAKE 1 TABLET(10 MG) BY MOUTH AT BEDTIME) 90 tablet 3   Multiple Vitamin (MULTIVITAMIN WITH  MINERALS) TABS tablet Take 1 tablet by mouth daily.     potassium chloride SA (KLOR-CON M) 20 MEQ tablet Take 1 tablet (20 mEq total) by mouth daily. 15 tablet 0   sertraline (ZOLOFT) 50 MG tablet Take 1 tablet (50 mg total) by mouth daily. 90 tablet 3   valACYclovir (VALTREX) 1000 MG tablet Take 1 tablet (1,000 mg total) by mouth 3 (three) times daily. As directed 30 tablet 0   No current facility-administered medications for this visit.     ALLERGIES: Penicillins  Family History  Problem Relation Age of Onset   Stroke Mother    Hypertension Mother    Diabetes Father    Hypertension Father    Aortic dissection Father    Thyroid disease Unknown    Thyroid cancer Unknown        Paternal cousin   Aortic aneurysm Unknown    Hashimoto's thyroiditis Sister    Stroke Maternal Grandmother    Stroke Maternal Grandfather    Stroke Paternal Grandmother    Dementia  Paternal Grandfather    Diabetes Paternal Aunt    Stroke Maternal Uncle    Ehlers-Danlos syndrome Son     Social History   Socioeconomic History   Marital status: Married    Spouse name: Not on file   Number of children: Not on file   Years of education: Not on file   Highest education level: Not on file  Occupational History    Comment: special place-- wigs , prosthetics  Tobacco Use   Smoking status: Never   Smokeless tobacco: Never  Vaping Use   Vaping Use: Never used  Substance and Sexual Activity   Alcohol use: Yes    Comment: rare   Drug use: No   Sexual activity: Yes    Partners: Male    Birth control/protection: Post-menopausal  Other Topics Concern   Not on file  Social History Narrative   Exercise--no   Social Determinants of Health   Financial Resource Strain: Not on file  Food Insecurity: Not on file  Transportation Needs: Not on file  Physical Activity: Not on file  Stress: Not on file  Social Connections: Not on file  Intimate Partner Violence: Not on file    Review of Systems  PHYSICAL EXAMINATION:    LMP 04/14/2017     General appearance: alert, cooperative and appears stated age Head: Normocephalic, without obvious abnormality, atraumatic Neck: no adenopathy, supple, symmetrical, trachea midline and thyroid normal to inspection and palpation Lungs: clear to auscultation bilaterally Breasts: normal appearance, no masses or tenderness, No nipple retraction or dimpling, No nipple discharge or bleeding, No axillary or supraclavicular adenopathy Heart: regular rate and rhythm Abdomen: soft, non-tender, no masses,  no organomegaly Extremities: extremities normal, atraumatic, no cyanosis or edema Skin: Skin color, texture, turgor normal. No rashes or lesions Lymph nodes: Cervical, supraclavicular, and axillary nodes normal. No abnormal inguinal nodes palpated Neurologic: Grossly normal  Pelvic: External genitalia:  no lesions               Urethra:  normal appearing urethra with no masses, tenderness or lesions              Bartholins and Skenes: normal                 Vagina: normal appearing vagina with normal color and discharge, no lesions              Cervix: no lesions  Bimanual Exam:  Uterus:  normal size, contour, position, consistency, mobility, non-tender              Adnexa: no mass, fullness, tenderness              Rectal exam: {yes no:[id number]}.  Confirms.              Anus:  normal sphincter tone, no lesions  Chaperone was present for exam:  ***  ASSESSMENT     PLAN     An After Visit Summary was printed and given to the patient.  ______ minutes face to face time of which over 50% was spent in counseling.

## 2021-09-06 ENCOUNTER — Telehealth: Payer: Self-pay | Admitting: Obstetrics and Gynecology

## 2021-09-06 NOTE — Telephone Encounter (Signed)
Left message for patient to call and rescheduled her June 26 missed appointment.

## 2021-09-27 ENCOUNTER — Ambulatory Visit (INDEPENDENT_AMBULATORY_CARE_PROVIDER_SITE_OTHER): Payer: 59 | Admitting: Family

## 2021-09-27 ENCOUNTER — Encounter: Payer: Self-pay | Admitting: Family

## 2021-09-27 ENCOUNTER — Other Ambulatory Visit: Payer: Self-pay | Admitting: Family

## 2021-09-27 VITALS — BP 124/78 | HR 78 | Temp 97.6°F | Resp 16 | Ht 64.0 in | Wt 227.4 lb

## 2021-09-27 DIAGNOSIS — E538 Deficiency of other specified B group vitamins: Secondary | ICD-10-CM

## 2021-09-27 DIAGNOSIS — Z13220 Encounter for screening for lipoid disorders: Secondary | ICD-10-CM | POA: Diagnosis not present

## 2021-09-27 DIAGNOSIS — Z0000 Encounter for general adult medical examination without abnormal findings: Secondary | ICD-10-CM | POA: Diagnosis not present

## 2021-09-27 DIAGNOSIS — E876 Hypokalemia: Secondary | ICD-10-CM

## 2021-09-27 DIAGNOSIS — R7309 Other abnormal glucose: Secondary | ICD-10-CM | POA: Diagnosis not present

## 2021-09-27 LAB — COMPREHENSIVE METABOLIC PANEL
ALT: 14 U/L (ref 0–35)
AST: 16 U/L (ref 0–37)
Albumin: 4.9 g/dL (ref 3.5–5.2)
Alkaline Phosphatase: 106 U/L (ref 39–117)
BUN: 8 mg/dL (ref 6–23)
CO2: 32 mEq/L (ref 19–32)
Calcium: 10.1 mg/dL (ref 8.4–10.5)
Chloride: 98 mEq/L (ref 96–112)
Creatinine, Ser: 0.84 mg/dL (ref 0.40–1.20)
GFR: 83.26 mL/min (ref >60.00)
Glucose, Bld: 98 mg/dL (ref 70–99)
Potassium: 3.3 mEq/L — ABNORMAL LOW (ref 3.5–5.1)
Sodium: 140 mEq/L (ref 135–145)
Total Bilirubin: 1.5 mg/dL — ABNORMAL HIGH (ref 0.2–1.2)
Total Protein: 7.4 g/dL (ref 6.0–8.3)

## 2021-09-27 LAB — VITAMIN B12: Vitamin B-12: 466 pg/mL (ref 211–911)

## 2021-09-27 LAB — LIPID PANEL
Cholesterol: 248 mg/dL — ABNORMAL HIGH (ref 0–200)
HDL: 65.1 mg/dL (ref >39.00)
LDL Cholesterol: 144 mg/dL — ABNORMAL HIGH (ref 0–99)
NonHDL: 182.9
Total CHOL/HDL Ratio: 4
Triglycerides: 193 mg/dL — ABNORMAL HIGH (ref 0.0–149.0)
VLDL: 38.6 mg/dL (ref 0.0–40.0)

## 2021-09-27 LAB — CBC WITH DIFFERENTIAL/PLATELET
Basophils Absolute: 0.1 10*3/uL (ref 0.0–0.1)
Basophils Relative: 1.3 % (ref 0.0–3.0)
Eosinophils Absolute: 0.4 10*3/uL (ref 0.0–0.7)
Eosinophils Relative: 7.1 % — ABNORMAL HIGH (ref 0.0–5.0)
HCT: 44.5 % (ref 36.0–46.0)
Hemoglobin: 14.9 g/dL (ref 12.0–15.0)
Lymphocytes Relative: 27.5 % (ref 12.0–46.0)
Lymphs Abs: 1.4 10*3/uL (ref 0.7–4.0)
MCHC: 33.4 g/dL (ref 30.0–36.0)
MCV: 95.6 fl (ref 78.0–100.0)
Monocytes Absolute: 0.4 10*3/uL (ref 0.1–1.0)
Monocytes Relative: 6.9 % (ref 3.0–12.0)
Neutro Abs: 3 10*3/uL (ref 1.4–7.7)
Neutrophils Relative %: 57.2 % (ref 43.0–77.0)
Platelets: 319 10*3/uL (ref 150.0–400.0)
RBC: 4.66 Mil/uL (ref 3.87–5.11)
RDW: 13.7 % (ref 11.5–15.5)
WBC: 5.3 10*3/uL (ref 4.0–10.5)

## 2021-09-27 LAB — HEMOGLOBIN A1C: Hgb A1c MFr Bld: 5.5 % (ref 4.6–6.5)

## 2021-09-27 LAB — TSH: TSH: 3.53 u[IU]/mL (ref 0.35–5.50)

## 2021-09-27 MED ORDER — POTASSIUM CHLORIDE CRYS ER 20 MEQ PO TBCR
20.0000 meq | EXTENDED_RELEASE_TABLET | Freq: Every day | ORAL | 0 refills | Status: AC
Start: 2021-09-27 — End: ?

## 2021-09-27 NOTE — Progress Notes (Signed)
Katelyn Wilcox is a 46 y.o. female with the following history as recorded in [employer]:  Patient Active Problem List   Diagnosis Date Noted   Acute bacterial sinusitis 01/21/2014   Obesity (BMI 30-39.9) 08/25/2013   HTN (hypertension) 08/21/2012   Depression 12/01/2009   URI 12/01/2009   Asthma in adult 02/27/2008    Current Outpatient Medications  Medication Sig Dispense Refill   albuterol (PROVENTIL) (2.5 MG/3ML) 0.083% nebulizer solution Take 3 mLs (2.5 mg total) by nebulization every 6 (six) hours as needed for wheezing or shortness of breath. 150 mL 1   albuterol (VENTOLIN HFA) 108 (90 Base) MCG/ACT inhaler Inhale 2 puffs into the lungs every 6 (six) hours as needed for wheezing or shortness of breath. 18 g 3   budesonide-formoterol (SYMBICORT) 160-4.5 MCG/ACT inhaler Inhale 2 puffs into the lungs 2 (two) times daily. 3 each 3   cetirizine (ZYRTEC) 10 MG tablet Take 1 tablet by mouth daily.     chlorthalidone (HYGROTON) 25 MG tablet TAKE 1 TABLET(25 MG) BY MOUTH DAILY (Patient taking differently: Take 25 mg by mouth daily. TAKE 1 TABLET(25 MG) BY MOUTH DAILY) 90 tablet 3   fluticasone (FLONASE) 50 MCG/ACT nasal spray 2 spray by Each Nare route daily. 16 g 3   ibuprofen (ADVIL) 800 MG tablet Take 1 tablet (800 mg total) by mouth every 8 (eight) hours as needed. 30 tablet 0   montelukast (SINGULAIR) 10 MG tablet TAKE 1 TABLET(10 MG) BY MOUTH AT BEDTIME (Patient taking differently: Take 10 mg by mouth at bedtime. TAKE 1 TABLET(10 MG) BY MOUTH AT BEDTIME) 90 tablet 3   Multiple Vitamin (MULTIVITAMIN WITH MINERALS) TABS tablet Take 1 tablet by mouth daily.     sertraline (ZOLOFT) 50 MG tablet Take 1 tablet (50 mg total) by mouth daily. 90 tablet 3   valACYclovir (VALTREX) 1000 MG tablet Take 1 tablet (1,000 mg total) by mouth 3 (three) times daily. As directed 30 tablet 0   No current facility-administered medications for this visit.    Allergies: Penicillins  Past Medical History:   Diagnosis Date   ADHD (attention deficit hyperactivity disorder)    doesnt take any meds for it   Anxiety    Asthma    COVID    May 2021 tired lasted a week. 08/12/2021   Depression    on Zoloft   Ehlers-Danlos syndrome    mixed connective disorder.  on occas. has joint pain and takes aleve   HSV-1 infection    Hypertension    Refusal of blood transfusions as patient is Jehovah's Witness 08/22/2021   Wears glasses    for reading only    Past Surgical History:  Procedure Laterality Date   DILATATION & CURETTAGE/HYSTEROSCOPY WITH MYOSURE N/A 08/22/2021   Procedure: DILATATION & CURETTAGE/HYSTEROSCOPY WITH MYOSURE;  Surgeon: Nunzio Cobbs, MD;  Location: [facility];  Service: Gynecology;  Laterality: N/A;   INTRAUTERINE DEVICE INSERTION  07/12/2016   Mirena   NASAL SEPTUM SURGERY      Family History  Problem Relation Age of Onset   Stroke Mother    Hypertension Mother    Diabetes Father    Hypertension Father    Aortic dissection Father    Thyroid disease Unknown    Thyroid cancer Unknown        Paternal cousin   Aortic aneurysm Unknown    Hashimoto's thyroiditis Sister    Stroke Maternal Grandmother    Stroke Maternal Grandfather  Stroke Paternal Grandmother    Dementia Paternal Grandfather    Diabetes Paternal Aunt    Stroke Maternal Uncle    Ehlers-Danlos syndrome Son     Social History   Tobacco Use   Smoking status: Never   Smokeless tobacco: Never  Substance Use Topics   Alcohol use: Yes    Comment: rare    Subjective:  Patient presents for yearly CPE; up to date on GYN needs;  Would like to get scheduled for Cologuard as opposed to Cologuard;  Has been having increased fatigue lately- feels that she is sleeping okay;   Review of Systems  Constitutional: Negative.   HENT: Negative.    Eyes: Negative.   Respiratory: Negative.    Cardiovascular: Negative.   Gastrointestinal: Negative.   Genitourinary: Negative.    Musculoskeletal: Negative.   Skin: Negative.   Neurological: Negative.   Endo/Heme/Allergies: Negative.   Psychiatric/Behavioral: Negative.          Objective:  Vitals:   09/27/21 0833  BP: 124/78  Pulse: 78  Resp: 16  Temp: 97.6 F (36.4 C)  TempSrc: Oral  SpO2: 95%  Weight: 227 lb 6.4 oz (103.1 kg)  Height: _0  (1.626 m)    General: Well developed, well nourished, in no acute distress  Skin : Warm and dry.  Head: Normocephalic and atraumatic  Eyes: Sclera and conjunctiva clear; pupils round and reactive to light; extraocular movements intact  Ears: External normal; canals clear; tympanic membranes normal  Oropharynx: Pink, supple. No suspicious lesions  Neck: Supple without thyromegaly, adenopathy  Lungs: Respirations unlabored; clear to auscultation bilaterally without wheeze, rales, rhonchi  CVS exam: normal rate and regular rhythm.  Abdomen: Soft; nontender; nondistended; normoactive bowel sounds; no masses or hepatosplenomegaly  Musculoskeletal: No deformities; no active joint inflammation  Extremities: No edema, cyanosis, clubbing  Vessels: Symmetric bilaterally  Neurologic: Alert and oriented; speech intact; face symmetrical; moves all extremities well; CNII-XII intact without focal deficit   Assessment:  1. PE (physical exam), annual   2. Lipid screening   3. Elevated glucose   4. Low vitamin B12 level     Plan:  Age appropriate preventive healthcare needs addressed; encouraged regular eye doctor and dental exams; encouraged regular exercise; will update labs and refills as needed today; follow-up to be determined; Patient is given information regarding Cologuard to make sure covered and will call back to let us know; To consider referral for sleep study and/ or follow up with allergy/ asthma specialist.   No follow-ups on file.  Orders Placed This Encounter  Procedures   CBC with Differential/Platelet   Comp Met (CMET)   Lipid panel   TSH    Hemoglobin A1c   B12    Requested Prescriptions    No prescriptions requested or ordered in this encounter

## 2022-02-06 ENCOUNTER — Encounter: Payer: Self-pay | Admitting: Family

## 2022-02-06 ENCOUNTER — Other Ambulatory Visit: Payer: Self-pay | Admitting: Family

## 2022-02-06 MED ORDER — NYSTATIN-TRIAMCINOLONE 100000-0.1 UNIT/GM-% EX CREA: 1.0000 | TOPICAL_CREAM | Freq: Two times a day (BID) | CUTANEOUS | 0 refills | Status: AC

## 2022-04-06 ENCOUNTER — Other Ambulatory Visit: Payer: Self-pay | Admitting: Family

## 2022-04-25 ENCOUNTER — Encounter: Payer: Self-pay | Admitting: Family

## 2022-04-25 MED ORDER — BUDESONIDE-FORMOTEROL FUMARATE 160-4.5 MCG/ACT IN AERO: 2.0000 | INHALATION_SPRAY | Freq: Two times a day (BID) | RESPIRATORY_TRACT | 3 refills | Status: AC

## 2022-05-10 ENCOUNTER — Other Ambulatory Visit: Payer: Self-pay | Admitting: Family

## 2022-05-10 DIAGNOSIS — I10 Essential (primary) hypertension: Secondary | ICD-10-CM

## 2022-05-10 NOTE — Telephone Encounter (Signed)
We had asked her last summer to follow up and get her potassium re-checked. The lab order is there.  We had also asked her to check on getting her Cologuard coverage clarified through her insurance. She is overdue for colon cancer screening. Does she want me to go ahead and place order for Cologuard?

## 2022-05-15 NOTE — Telephone Encounter (Signed)
Spoke with pt, pt was not aware she had a order in place for her labs. Pt stated her insurance changed again in February, in which case she's not sure if the cologuard is covered. Pt states she will call her insurance to verify what is covered and then call the office back to let us know.

## 2022-06-07 ENCOUNTER — Encounter: Payer: Self-pay | Admitting: Family

## 2022-07-09 ENCOUNTER — Other Ambulatory Visit: Payer: Self-pay | Admitting: Family

## 2022-10-25 ENCOUNTER — Other Ambulatory Visit: Payer: Self-pay | Admitting: Family

## 2022-10-25 DIAGNOSIS — I10 Essential (primary) hypertension: Secondary | ICD-10-CM

## 2022-12-06 ENCOUNTER — Other Ambulatory Visit: Payer: Self-pay | Admitting: Family

## 2022-12-06 DIAGNOSIS — I10 Essential (primary) hypertension: Secondary | ICD-10-CM

## 2023-02-02 ENCOUNTER — Other Ambulatory Visit: Payer: Self-pay | Admitting: Family

## 2023-02-02 DIAGNOSIS — I10 Essential (primary) hypertension: Secondary | ICD-10-CM

## 2023-09-28 ENCOUNTER — Encounter: Payer: Self-pay | Admitting: Advanced Practice Midwife

## 2023-12-17 IMAGING — MG MM DIGITAL SCREENING BILAT W/ TOMO AND CAD
8 series · 8 of 24 positions shown · non-contrast
Comparison: Previous exam(s).

CLINICAL DATA: Screening.

EXAM:
DIGITAL SCREENING BILATERAL MAMMOGRAM WITH TOMOSYNTHESIS AND CAD
TECHNIQUE: Bilateral screening digital craniocaudal and mediolateral oblique
mammograms were obtained. Bilateral screening digital breast
tomosynthesis was performed. The images were evaluated with
computer-aided detection.

[L CC synth-2D]
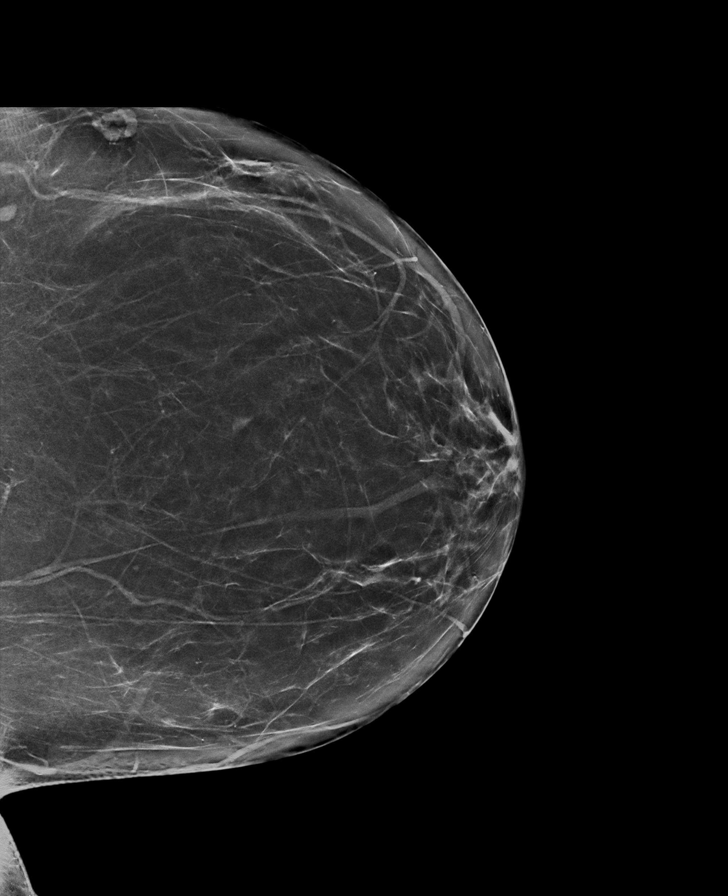

[R MLO synth-2D]
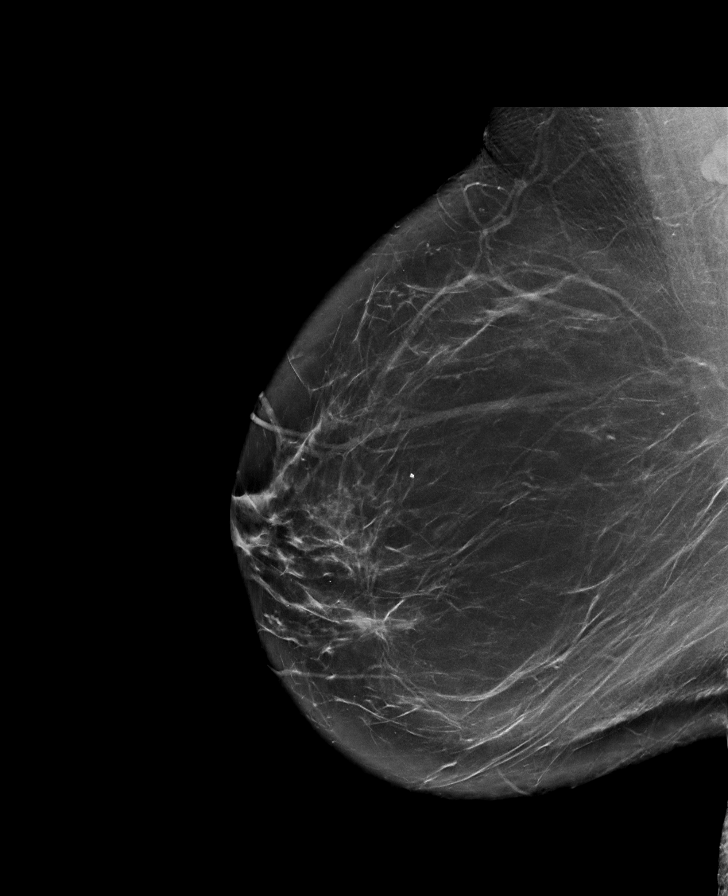

[L MLO synth-2D]
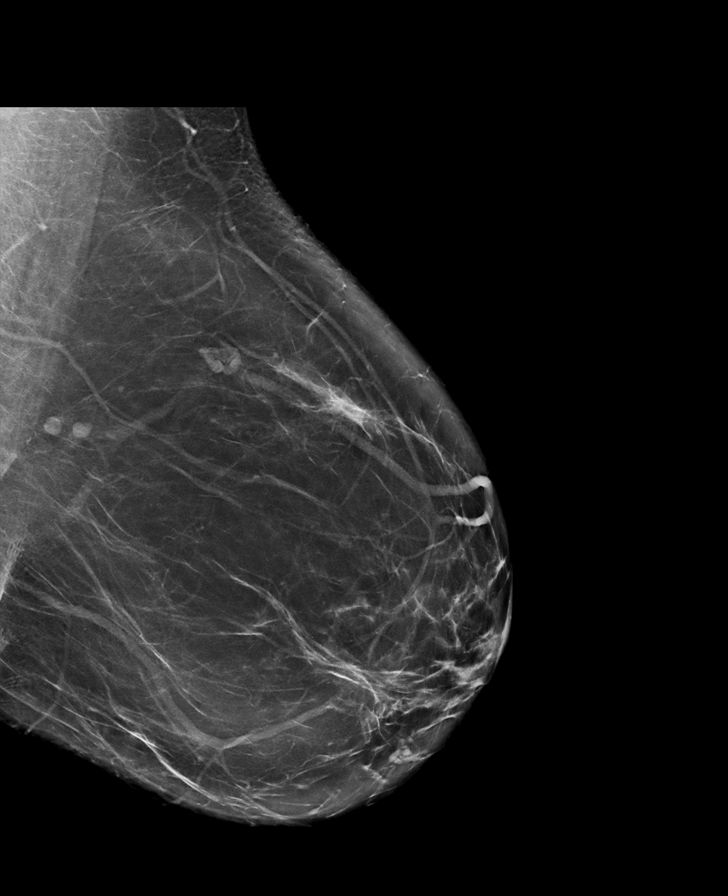

[R CC synth-2D]
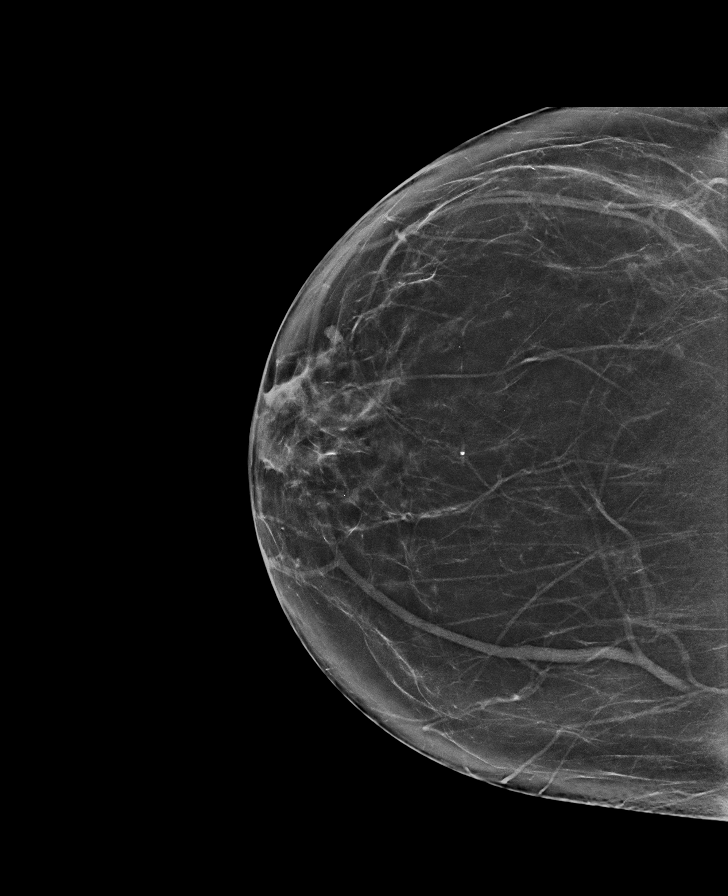

[L MLO tomo · tomo slice 45/88.0]
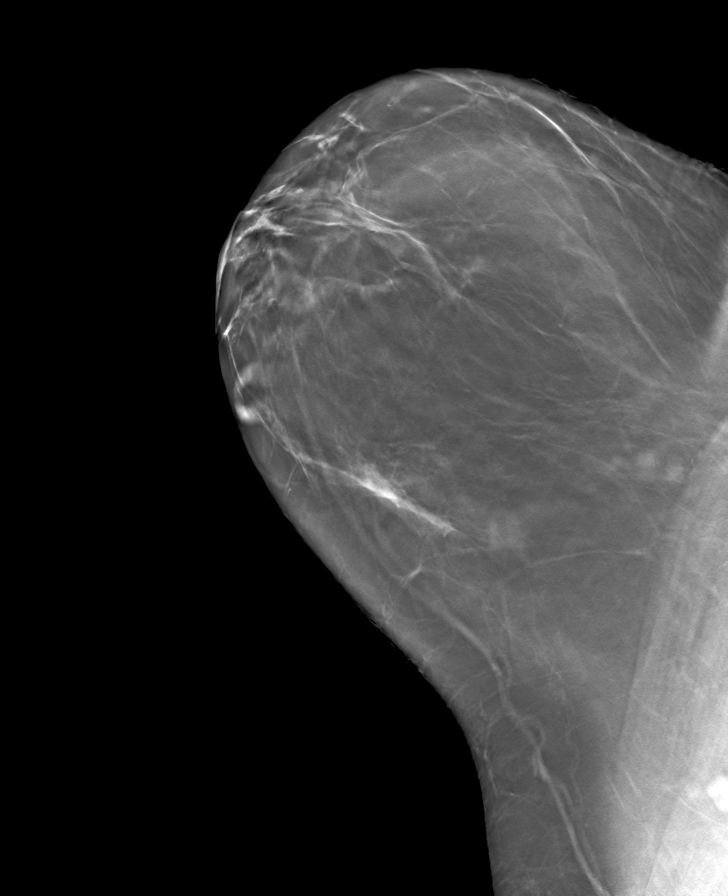

[R CC tomo · tomo slice 39/77.0]
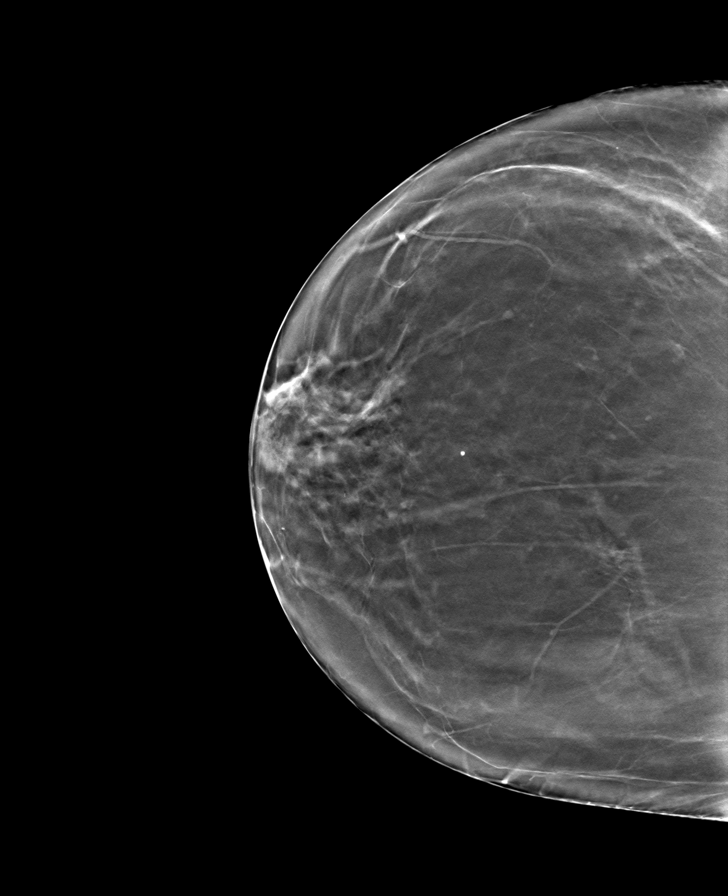

[L CC tomo · tomo slice 37/73.0]
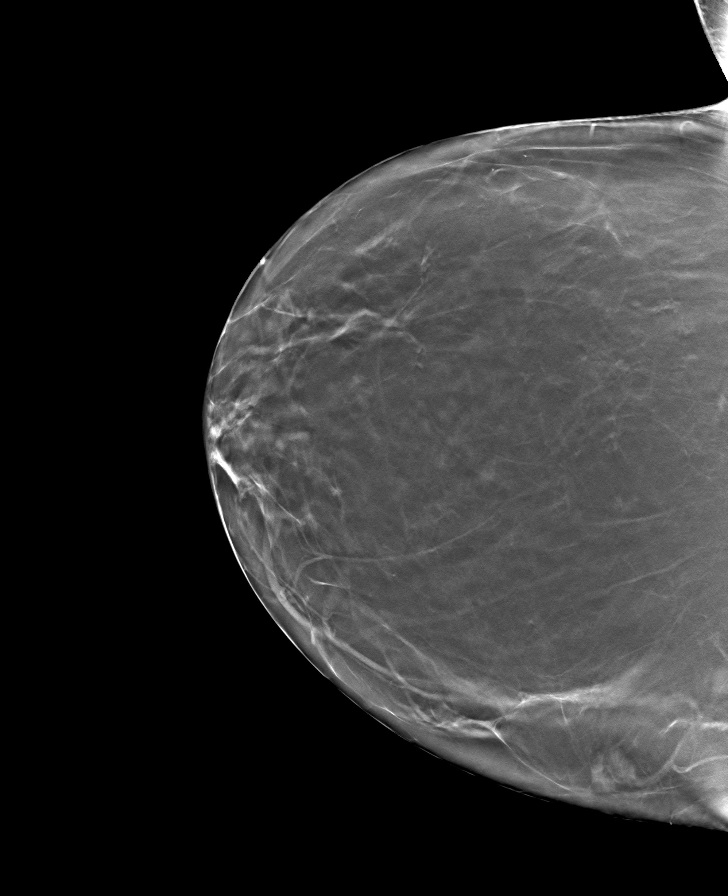

[R MLO tomo · tomo slice 45/90.0]
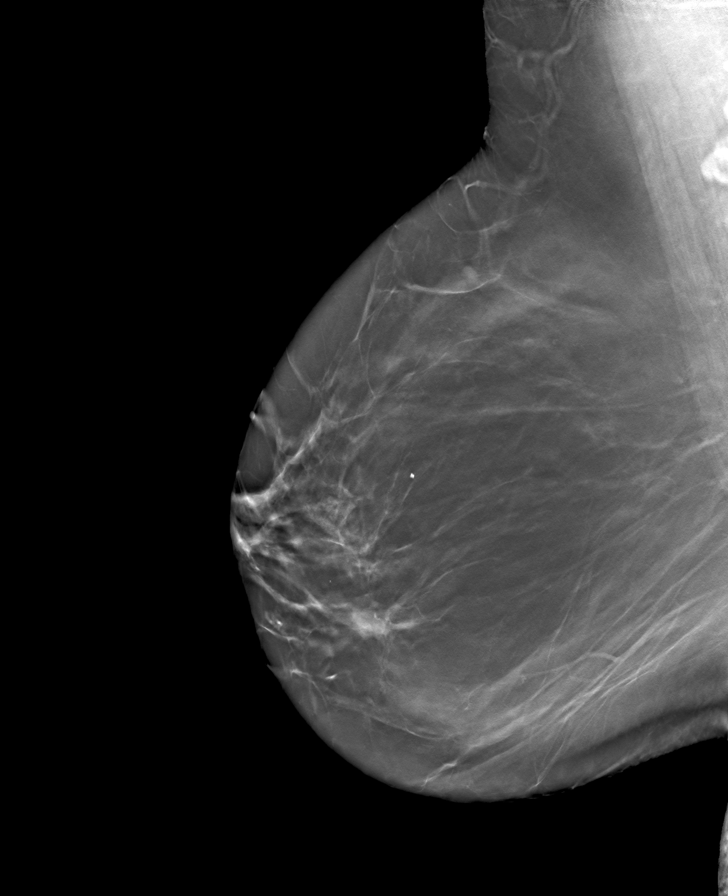

[8 of 24 positions shown; findings below may reference images not displayed]

ACR Breast Density Category b: There are scattered areas of
fibroglandular density.
FINDINGS: There are no findings suspicious for malignancy.
IMPRESSION: No mammographic evidence of malignancy. A result letter of this
screening mammogram will be mailed directly to the patient.

RECOMMENDATION:
Screening mammogram in one year. (Code:51-O-LD2)

BI-RADS CATEGORY  1: Negative.
